# Patient Record
Sex: Male | Born: 1955 | Race: White | Hispanic: No | Marital: Married | Smoking: Former smoker
Health system: Southern US, Community
[De-identification: ages and names within clinical notes are randomized; demographics above are authoritative.]

## PROBLEM LIST (undated history)

## (undated) DIAGNOSIS — T7840XA Allergy, unspecified, initial encounter: Secondary | ICD-10-CM

## (undated) DIAGNOSIS — K219 Gastro-esophageal reflux disease without esophagitis: Secondary | ICD-10-CM

## (undated) DIAGNOSIS — E785 Hyperlipidemia, unspecified: Secondary | ICD-10-CM

## (undated) DIAGNOSIS — I2510 Atherosclerotic heart disease of native coronary artery without angina pectoris: Secondary | ICD-10-CM

## (undated) DIAGNOSIS — E119 Type 2 diabetes mellitus without complications: Secondary | ICD-10-CM

## (undated) DIAGNOSIS — J45909 Unspecified asthma, uncomplicated: Secondary | ICD-10-CM

## (undated) DIAGNOSIS — N419 Inflammatory disease of prostate, unspecified: Secondary | ICD-10-CM

## (undated) DIAGNOSIS — F329 Major depressive disorder, single episode, unspecified: Secondary | ICD-10-CM

## (undated) DIAGNOSIS — F419 Anxiety disorder, unspecified: Secondary | ICD-10-CM

## (undated) DIAGNOSIS — I10 Essential (primary) hypertension: Secondary | ICD-10-CM

## (undated) DIAGNOSIS — R809 Proteinuria, unspecified: Secondary | ICD-10-CM

## (undated) DIAGNOSIS — G4730 Sleep apnea, unspecified: Secondary | ICD-10-CM

## (undated) DIAGNOSIS — I219 Acute myocardial infarction, unspecified: Secondary | ICD-10-CM

## (undated) DIAGNOSIS — K5790 Diverticulosis of intestine, part unspecified, without perforation or abscess without bleeding: Secondary | ICD-10-CM

## (undated) DIAGNOSIS — R011 Cardiac murmur, unspecified: Secondary | ICD-10-CM

## (undated) DIAGNOSIS — F32A Depression, unspecified: Secondary | ICD-10-CM

## (undated) HISTORY — PX: CORONARY ANGIOPLASTY: SHX604

## (undated) HISTORY — PX: OTHER SURGICAL HISTORY: SHX169

## (undated) HISTORY — PX: TONSILLECTOMY: SUR1361

---

## 1994-09-28 HISTORY — PX: CORONARY ARTERY BYPASS GRAFT: SHX141

## 2005-04-09 ENCOUNTER — Ambulatory Visit: Payer: Self-pay | Admitting: Internal Medicine

## 2006-03-26 ENCOUNTER — Ambulatory Visit: Payer: Self-pay | Admitting: Unknown Physician Specialty

## 2006-07-16 ENCOUNTER — Ambulatory Visit: Payer: Self-pay | Admitting: Otolaryngology

## 2006-09-23 ENCOUNTER — Ambulatory Visit: Payer: Self-pay | Admitting: Unknown Physician Specialty

## 2006-10-28 ENCOUNTER — Inpatient Hospital Stay: Payer: Self-pay | Admitting: Surgery

## 2006-12-09 HISTORY — PX: COLON SURGERY: SHX602

## 2007-06-20 ENCOUNTER — Ambulatory Visit: Payer: Self-pay | Admitting: Emergency Medicine

## 2007-08-21 ENCOUNTER — Inpatient Hospital Stay: Payer: Self-pay | Admitting: Internal Medicine

## 2007-08-21 ENCOUNTER — Other Ambulatory Visit: Payer: Self-pay

## 2008-04-11 ENCOUNTER — Ambulatory Visit: Payer: Self-pay | Admitting: Internal Medicine

## 2008-04-19 ENCOUNTER — Ambulatory Visit: Payer: Self-pay | Admitting: Internal Medicine

## 2008-06-09 ENCOUNTER — Ambulatory Visit: Payer: Self-pay | Admitting: Internal Medicine

## 2009-03-24 ENCOUNTER — Ambulatory Visit: Payer: Self-pay | Admitting: Internal Medicine

## 2011-07-29 ENCOUNTER — Ambulatory Visit: Payer: Self-pay | Admitting: Unknown Physician Specialty

## 2012-05-20 DIAGNOSIS — I25119 Atherosclerotic heart disease of native coronary artery with unspecified angina pectoris: Secondary | ICD-10-CM | POA: Insufficient documentation

## 2013-11-19 ENCOUNTER — Ambulatory Visit: Payer: Self-pay | Admitting: Unknown Physician Specialty

## 2013-11-19 HISTORY — PX: ESOPHAGOGASTRODUODENOSCOPY: SHX1529

## 2013-11-22 LAB — PATHOLOGY REPORT

## 2014-03-15 ENCOUNTER — Emergency Department: Payer: Self-pay | Admitting: Emergency Medicine

## 2014-06-02 DIAGNOSIS — R809 Proteinuria, unspecified: Secondary | ICD-10-CM | POA: Insufficient documentation

## 2016-04-03 ENCOUNTER — Encounter: Payer: Self-pay | Admitting: Gynecology

## 2016-04-03 ENCOUNTER — Ambulatory Visit
Admission: EM | Admit: 2016-04-03 | Discharge: 2016-04-03 | Disposition: A | Discharge status: Home / Self Care | Payer: BLUE CROSS/BLUE SHIELD | Attending: Family Medicine | Admitting: Family Medicine

## 2016-04-03 DIAGNOSIS — W57XXXA Bitten or stung by nonvenomous insect and other nonvenomous arthropods, initial encounter: Secondary | ICD-10-CM | POA: Diagnosis not present

## 2016-04-03 DIAGNOSIS — T148 Other injury of unspecified body region: Secondary | ICD-10-CM

## 2016-04-03 HISTORY — DX: Unspecified asthma, uncomplicated: J45.909

## 2016-04-03 HISTORY — DX: Hyperlipidemia, unspecified: E78.5

## 2016-04-03 HISTORY — DX: Anxiety disorder, unspecified: F41.9

## 2016-04-03 HISTORY — DX: Gastro-esophageal reflux disease without esophagitis: K21.9

## 2016-04-03 HISTORY — DX: Sleep apnea, unspecified: G47.30

## 2016-04-03 HISTORY — DX: Inflammatory disease of prostate, unspecified: N41.9

## 2016-04-03 HISTORY — DX: Major depressive disorder, single episode, unspecified: F32.9

## 2016-04-03 HISTORY — DX: Cardiac murmur, unspecified: R01.1

## 2016-04-03 HISTORY — DX: Type 2 diabetes mellitus without complications: E11.9

## 2016-04-03 HISTORY — DX: Depression, unspecified: F32.A

## 2016-04-03 MED ORDER — DOXYCYCLINE HYCLATE 100 MG PO CAPS: 100.0000 mg | ORAL_CAPSULE | Freq: Two times a day (BID) | ORAL | Status: DC

## 2016-04-03 NOTE — ED Provider Notes (Signed)
CSN: [id number]     Arrival date & time 04/03/16  1952 History   First MD Initiated Contact with Patient 04/03/16 2008     Chief Complaint  Patient presents with  . Insect Bite   (Consider location/radiation/quality/duration/timing/severity/associated sxs/prior Treatment) HPI: Patient presents today with what he believes to be a tick bite on his right inner thigh. Patient states that his wife tried to get a portion of it out before coming here today. He is unable to see it given the location. He is unsure as to how long it has been there but he was outdoors earlier today. He denies any other problems today. He denies any history of Lyme's Disease or [place] Spotted Fever.  No past medical history on file. No past surgical history on file. No family history on file. Social History  Substance Use Topics  . Smoking status: Not on file  . Smokeless tobacco: Not on file  . Alcohol Use: Not on file    Review of Systems : Negative except mentioned above.   Allergies  Ace inhibitors  Home Medications   Prior to Admission medications   Medication Sig Start Date End Date Taking? Authorizing Provider  doxycycline (VIBRAMYCIN) 100 MG capsule Take 1 capsule (100 mg total) by mouth 2 (two) times daily. 04/03/16   Jolene ProvostKirtida Donavyn Fecher, MD   Meds Ordered and Administered this Visit  Medications - No data to display  BP 153/56 mmHg  Pulse 70  Temp(Src) 98.5 F (36.9 C) (Oral)  Resp 16  Ht 5\' 9"  (1.753 m)  Wt 195 lb (88.451 kg)  BMI 28.78 kg/m2  SpO2 98% No data found.   Physical Exam   GENERAL: NAD RESP: CTA B CARD: RRR SKIN: 0.5 in circular erythematous lesion on right inner thigh, there is half the tick engorged in the center NEURO: CN II-XII grossly intact   ED Course  Procedures (including critical care time)  Labs Review Labs Reviewed - No data to display  Imaging Review No results found.      MDM   1. Tick bite    Splinter forceps were used to take out the  portion of the tick that was engorged in the site. Patient tolerated the procedure well. Patient with doxycycline for 10 days. Encourage patient to monitor for any tick borne illness symptoms. If any further problems to follow up with primary care physician or our office.    Jolene ProvostKirtida Kenniel Bergsma, MD 04/03/16 2037

## 2016-04-03 NOTE — ED Notes (Signed)
Patient c/o ? Insect bite right inner thigh x today.

## 2016-09-12 ENCOUNTER — Encounter: Payer: Self-pay | Admitting: *Deleted

## 2016-09-13 ENCOUNTER — Ambulatory Visit
Admission: RE | Admit: 2016-09-13 | Discharge: 2016-09-13 | Disposition: A | Discharge status: Home / Self Care | Payer: BLUE CROSS/BLUE SHIELD | Source: Ambulatory Visit | Attending: Unknown Physician Specialty | Admitting: Unknown Physician Specialty

## 2016-09-13 ENCOUNTER — Ambulatory Visit: Payer: BLUE CROSS/BLUE SHIELD | Admitting: Anesthesiology

## 2016-09-13 ENCOUNTER — Encounter: Payer: Self-pay | Admitting: *Deleted

## 2016-09-13 ENCOUNTER — Encounter
Admission: RE | Disposition: A | Discharge status: Home / Self Care | Payer: Self-pay | Source: Ambulatory Visit | Attending: Unknown Physician Specialty

## 2016-09-13 DIAGNOSIS — I252 Old myocardial infarction: Secondary | ICD-10-CM | POA: Insufficient documentation

## 2016-09-13 DIAGNOSIS — F329 Major depressive disorder, single episode, unspecified: Secondary | ICD-10-CM | POA: Diagnosis not present

## 2016-09-13 DIAGNOSIS — I2510 Atherosclerotic heart disease of native coronary artery without angina pectoris: Secondary | ICD-10-CM | POA: Insufficient documentation

## 2016-09-13 DIAGNOSIS — G4730 Sleep apnea, unspecified: Secondary | ICD-10-CM | POA: Diagnosis not present

## 2016-09-13 DIAGNOSIS — Z7982 Long term (current) use of aspirin: Secondary | ICD-10-CM | POA: Diagnosis not present

## 2016-09-13 DIAGNOSIS — Z1211 Encounter for screening for malignant neoplasm of colon: Secondary | ICD-10-CM | POA: Diagnosis not present

## 2016-09-13 DIAGNOSIS — Z87891 Personal history of nicotine dependence: Secondary | ICD-10-CM | POA: Insufficient documentation

## 2016-09-13 DIAGNOSIS — F419 Anxiety disorder, unspecified: Secondary | ICD-10-CM | POA: Insufficient documentation

## 2016-09-13 DIAGNOSIS — Z7984 Long term (current) use of oral hypoglycemic drugs: Secondary | ICD-10-CM | POA: Insufficient documentation

## 2016-09-13 DIAGNOSIS — J45909 Unspecified asthma, uncomplicated: Secondary | ICD-10-CM | POA: Insufficient documentation

## 2016-09-13 DIAGNOSIS — K5730 Diverticulosis of large intestine without perforation or abscess without bleeding: Secondary | ICD-10-CM | POA: Insufficient documentation

## 2016-09-13 DIAGNOSIS — I10 Essential (primary) hypertension: Secondary | ICD-10-CM | POA: Insufficient documentation

## 2016-09-13 DIAGNOSIS — K640 First degree hemorrhoids: Secondary | ICD-10-CM | POA: Insufficient documentation

## 2016-09-13 DIAGNOSIS — Z955 Presence of coronary angioplasty implant and graft: Secondary | ICD-10-CM | POA: Insufficient documentation

## 2016-09-13 DIAGNOSIS — Z79899 Other long term (current) drug therapy: Secondary | ICD-10-CM | POA: Diagnosis not present

## 2016-09-13 DIAGNOSIS — Z8371 Family history of colonic polyps: Secondary | ICD-10-CM | POA: Insufficient documentation

## 2016-09-13 DIAGNOSIS — E119 Type 2 diabetes mellitus without complications: Secondary | ICD-10-CM | POA: Diagnosis not present

## 2016-09-13 DIAGNOSIS — K219 Gastro-esophageal reflux disease without esophagitis: Secondary | ICD-10-CM | POA: Diagnosis not present

## 2016-09-13 DIAGNOSIS — E785 Hyperlipidemia, unspecified: Secondary | ICD-10-CM | POA: Insufficient documentation

## 2016-09-13 HISTORY — DX: Diverticulosis of intestine, part unspecified, without perforation or abscess without bleeding: K57.90

## 2016-09-13 HISTORY — DX: Essential (primary) hypertension: I10

## 2016-09-13 HISTORY — DX: Proteinuria, unspecified: R80.9

## 2016-09-13 HISTORY — DX: Atherosclerotic heart disease of native coronary artery without angina pectoris: I25.10

## 2016-09-13 HISTORY — PX: COLONOSCOPY WITH PROPOFOL: SHX5780

## 2016-09-13 HISTORY — DX: Allergy, unspecified, initial encounter: T78.40XA

## 2016-09-13 LAB — GLUCOSE, CAPILLARY: GLUCOSE-CAPILLARY: 127 mg/dL — AB (ref 65–99)

## 2016-09-13 SURGERY — COLONOSCOPY WITH PROPOFOL
Anesthesia: General

## 2016-09-13 MED ORDER — SODIUM CHLORIDE 0.9 % IV SOLN
INTRAVENOUS | Status: DC
  Administered 2016-09-13: 1000 mL via INTRAVENOUS

## 2016-09-13 MED ORDER — FENTANYL CITRATE (PF) 100 MCG/2ML IJ SOLN
INTRAMUSCULAR | Status: DC | PRN
  Administered 2016-09-13: 50 ug via INTRAVENOUS

## 2016-09-13 MED ORDER — METOPROLOL SUCCINATE ER 25 MG PO TB24
ORAL_TABLET | ORAL | Status: AC
  Administered 2016-09-13: 25 mg
  Filled 2016-09-13: qty 1

## 2016-09-13 MED ORDER — PROPOFOL 10 MG/ML IV BOLUS
INTRAVENOUS | Status: DC | PRN
  Administered 2016-09-13: 20 mg via INTRAVENOUS
  Administered 2016-09-13: 50 mg via INTRAVENOUS

## 2016-09-13 MED ORDER — PROPOFOL 500 MG/50ML IV EMUL
INTRAVENOUS | Status: DC | PRN
  Administered 2016-09-13: 100 ug/kg/min via INTRAVENOUS

## 2016-09-13 MED ORDER — LIDOCAINE HCL (CARDIAC) 20 MG/ML IV SOLN
INTRAVENOUS | Status: DC | PRN
  Administered 2016-09-13: 30 mg via INTRAVENOUS

## 2016-09-13 NOTE — Anesthesia Preprocedure Evaluation (Signed)
Anesthesia Evaluation  Patient identified by MRN, date of birth, ID band Patient awake    Reviewed: Allergy & Precautions, NPO status , Patient's Chart, lab work & pertinent test results  History of Anesthesia Complications Negative for: history of anesthetic complications  Airway Mallampati: II       Dental   Pulmonary asthma , sleep apnea , former smoker,           Cardiovascular hypertension, Pt. on medications and Pt. on home beta blockers + CAD, + Past MI, + Cardiac Stents and + CABG  + Valvular Problems/Murmurs (murmur)      Neuro/Psych negative neurological ROS     GI/Hepatic Neg liver ROS, GERD  Medicated and Poorly Controlled,  Endo/Other  diabetes, Type 2, Oral Hypoglycemic Agents  Renal/GU negative Renal ROS     Musculoskeletal   Abdominal   Peds  Hematology negative hematology ROS (+)   Anesthesia Other Findings   Reproductive/Obstetrics                             Anesthesia Physical Anesthesia Plan  ASA: III  Anesthesia Plan: General   Post-op Pain Management:    Induction: Intravenous  Airway Management Planned: Nasal Cannula  Additional Equipment:   Intra-op Plan:   Post-operative Plan:   Informed Consent: I have reviewed the patients History and Physical, chart, labs and discussed the procedure including the risks, benefits and alternatives for the proposed anesthesia with the patient or authorized representative who has indicated his/her understanding and acceptance.     Plan Discussed with:   Anesthesia Plan Comments:         Anesthesia Quick Evaluation

## 2016-09-13 NOTE — Op Note (Addendum)
[facility] Gastroenterology Patient Name: Nicholas Knapp Procedure Date: 09/13/2016 9:44 AM MRN: [id number] Account #: [id number] Date of Birth: June 15, 1956 Admit Type: Outpatient Age: 60 Room: [facility] ENDO ROOM 4 Gender: Male Note Status: Finalized Procedure:            Colonoscopy Indications:          Colon cancer screening in patient at increased risk:                        Family history of 1st-degree relative with colon polyps Providers:            Scot Jun, MD Referring MD:         Daniel Nones, MD (Referring MD) Medicines:            Propofol per Anesthesia Complications:        No immediate complications. Procedure:            Pre-Anesthesia Assessment:                       - After reviewing the risks and benefits, the patient                        was deemed in satisfactory condition to undergo the                        procedure.                       After obtaining informed consent, the colonoscope was                        passed under direct vision. Throughout the procedure,                        the patient's blood pressure, pulse, and oxygen                        saturations were monitored continuously. The                        Colonoscope was introduced through the anus and                        advanced to the the cecum, identified by appendiceal                        orifice and ileocecal valve. The colonoscopy was                        somewhat difficult. The patient tolerated the procedure                        well. The quality of the bowel preparation was                        excellent. Findings:      A few small and large-mouthed diverticula were found in the sigmoid       colon.      Internal hemorrhoids were found during endoscopy. The hemorrhoids were       medium-sized and Grade I (internal hemorrhoids  that do not prolapse).      The exam was otherwise without abnormality. Impression:           - Diverticulosis in  the sigmoid colon.                       - Internal hemorrhoids.                       - The examination was otherwise normal.                       - No specimens collected. Recommendation:       - Repeat colonoscopy in 5 years for screening purposes. Scot Junobert T Kinzlee Selvy, MD 09/13/2016 10:53:20 AM This report has been signed electronically. Number of Addenda: 0 Note Initiated On: 09/13/2016 9:44 AM Scope Withdrawal Time: 0 hours 13 minutes 32 seconds  Total Procedure Duration: 0 hours 21 minutes 39 seconds       [facility]

## 2016-09-13 NOTE — Transfer of Care (Signed)
Immediate Anesthesia Transfer of Care Note  Patient: Nicholas Knapp  Procedure(s) Performed: Procedure(s): COLONOSCOPY WITH PROPOFOL (N/A)  Patient Location: PACU and Endoscopy Unit  Anesthesia Type:General  Level of Consciousness: alert   Airway & Oxygen Therapy: Patient connected to nasal cannula oxygen  Post-op Assessment: Report given to RN  Post vital signs: stable  Last Vitals:  Vitals:   09/13/16 0937  BP: 134/73  Pulse: 65  Resp: 14  Temp: 36.2 C    Last Pain:  Vitals:   09/13/16 0937  TempSrc: Tympanic         Complications: No apparent anesthesia complications

## 2016-09-13 NOTE — Anesthesia Postprocedure Evaluation (Signed)
Anesthesia Post Note  Patient: Nicholas Knapp  Procedure(s) Performed: Procedure(s) (LRB): COLONOSCOPY WITH PROPOFOL (N/A)  Patient location during evaluation: Endoscopy Anesthesia Type: General Level of consciousness: awake and alert Pain management: pain level controlled Vital Signs Assessment: post-procedure vital signs reviewed and stable Respiratory status: spontaneous breathing and respiratory function stable Cardiovascular status: stable Anesthetic complications: no    Last Vitals:  Vitals:   09/13/16 1055 09/13/16 1105  BP: (!) 117/58 108/72  Pulse: 72 62  Resp: 15 14  Temp: 36.2 C     Last Pain:  Vitals:   09/13/16 1055  TempSrc: Tympanic  PainSc: Asleep                 KEPHART,WILLIAM K

## 2016-09-13 NOTE — H&P (Signed)
Primary Care Physician:  Lynnea FerrierBERT J KLEIN III, MD Primary Gastroenterologist:  Dr. Mechele CollinElliott  Pre-Procedure History & Physical: HPI:  Nicholas Knapp is a 60 y.o. male is here for an colonoscopy.   Past Medical History:  Diagnosis Date  . Allergic state   . Anxiety   . Asthma   . Coronary artery disease   . Depression   . Diabetes mellitus without complication (HCC)   . Diverticulosis   . GERD (gastroesophageal reflux disease)   . Hyperlipemia   . Hypertension   . Midsystolic murmur   . Prostatitis   . Proteinuria   . Sleep apnea     Past Surgical History:  Procedure Laterality Date  . COLON SURGERY    . CORONARY ANGIOPLASTY    . double bipass    . sinus sugery    . TONSILLECTOMY      Prior to Admission medications   Medication Sig Start Date End Date Taking? Authorizing Provider  glimepiride (AMARYL) 2 MG tablet Take 2 mg by mouth daily with breakfast.   Yes Historical Provider, MD  metoprolol succinate (TOPROL-XL) 25 MG 24 hr tablet Take 25 mg by mouth daily.   Yes Historical Provider, MD  aspirin 81 MG tablet Take 81 mg by mouth daily.    Historical Provider, MD  DULoxetine (CYMBALTA) 60 MG capsule Take 60 mg by mouth daily.    Historical Provider, MD  EPINEPHrine (EPIPEN 2-PAK) 0.3 mg/0.3 mL IJ SOAJ injection Inject into the muscle once.    Historical Provider, MD  glucose blood test strip 1 each by Other route as needed for other. Use as instructed    Historical Provider, MD  losartan (COZAAR) 25 MG tablet Take 25 mg by mouth daily.    Historical Provider, MD  nitroGLYCERIN (NITROSTAT) 0.4 MG SL tablet Place 0.4 mg under the tongue every 5 (five) minutes as needed for chest pain.    Historical Provider, MD  pantoprazole (PROTONIX) 40 MG tablet Take 40 mg by mouth daily.    Historical Provider, MD  rosuvastatin (CRESTOR) 20 MG tablet Take 20 mg by mouth daily.    Historical Provider, MD    Allergies as of 07/29/2016 - Review Complete 04/03/2016  Allergen Reaction  Noted  . Bee venom Anaphylaxis 04/03/2016  . Ace inhibitors Cough 04/03/2016  . Isosorbide  04/03/2016  . Lamotrigine  04/03/2016  . Metformin and related Diarrhea 04/03/2016  . Bupropion Palpitations 04/03/2016    History reviewed. No pertinent family history.  Social History   Social History  . Marital status: Married    Spouse name: N/A  . Number of children: N/A  . Years of education: N/A   Occupational History  . Not on file.   Social History Main Topics  . Smoking status: Former Smoker    Packs/day: 1.00    Years: 25.00    Types: Cigarettes    Quit date: 05/20/1994  . Smokeless tobacco: Never Used  . Alcohol use Yes  . Drug use: No  . Sexual activity: Not on file   Other Topics Concern  . Not on file   Social History Narrative  . No narrative on file    Review of Systems: See HPI, otherwise negative ROS  Physical Exam: BP 134/73   Pulse 65   Temp 97.2 F (36.2 C) (Tympanic)   Resp 14   Ht 5\' 9"  (1.753 m)   Wt 88.5 kg (195 lb)   SpO2 99%   BMI 28.80 kg/m  General:   Alert,  pleasant and cooperative in NAD Head:  Normocephalic and atraumatic. Neck:  Supple; no masses or thyromegaly. Lungs:  Clear throughout to auscultation.    Heart:  Regular rate and rhythm. Abdomen:  Soft, nontender and nondistended. Normal bowel sounds, without guarding, and without rebound.   Neurologic:  Alert and  oriented x4;  grossly normal neurologically.  Impression/Plan: Nicholas Knapp is here for an colonoscopy to be performed for FH colon polyps  Risks, benefits, limitations, and alternatives regarding  colonoscopy have been reviewed with the patient.  Questions have been answered.  All parties agreeable.   Lynnae Prude, MD  09/13/2016, 10:24 AM

## 2016-09-15 NOTE — Progress Notes (Signed)
Non-identifying voicemail.  No message left. 

## 2016-09-16 ENCOUNTER — Encounter: Payer: Self-pay | Admitting: Unknown Physician Specialty

## 2018-03-18 DIAGNOSIS — M7712 Lateral epicondylitis, left elbow: Secondary | ICD-10-CM | POA: Insufficient documentation

## 2018-03-23 ENCOUNTER — Encounter
Admission: RE | Admit: 2018-03-23 | Discharge: 2018-03-23 | Disposition: A | Discharge status: Home / Self Care | Payer: BLUE CROSS/BLUE SHIELD | Source: Ambulatory Visit | Attending: Surgery | Admitting: Surgery

## 2018-03-23 ENCOUNTER — Other Ambulatory Visit: Payer: Self-pay

## 2018-03-23 DIAGNOSIS — I25810 Atherosclerosis of coronary artery bypass graft(s) without angina pectoris: Secondary | ICD-10-CM | POA: Diagnosis not present

## 2018-03-23 DIAGNOSIS — E119 Type 2 diabetes mellitus without complications: Secondary | ICD-10-CM | POA: Diagnosis not present

## 2018-03-23 DIAGNOSIS — G4730 Sleep apnea, unspecified: Secondary | ICD-10-CM | POA: Diagnosis not present

## 2018-03-23 DIAGNOSIS — Z01810 Encounter for preprocedural cardiovascular examination: Secondary | ICD-10-CM

## 2018-03-23 DIAGNOSIS — Z955 Presence of coronary angioplasty implant and graft: Secondary | ICD-10-CM | POA: Diagnosis not present

## 2018-03-23 DIAGNOSIS — F419 Anxiety disorder, unspecified: Secondary | ICD-10-CM | POA: Diagnosis not present

## 2018-03-23 DIAGNOSIS — Z7982 Long term (current) use of aspirin: Secondary | ICD-10-CM | POA: Diagnosis not present

## 2018-03-23 DIAGNOSIS — K219 Gastro-esophageal reflux disease without esophagitis: Secondary | ICD-10-CM | POA: Diagnosis not present

## 2018-03-23 DIAGNOSIS — I252 Old myocardial infarction: Secondary | ICD-10-CM | POA: Diagnosis not present

## 2018-03-23 DIAGNOSIS — E785 Hyperlipidemia, unspecified: Secondary | ICD-10-CM | POA: Diagnosis not present

## 2018-03-23 DIAGNOSIS — I10 Essential (primary) hypertension: Secondary | ICD-10-CM | POA: Diagnosis not present

## 2018-03-23 DIAGNOSIS — F329 Major depressive disorder, single episode, unspecified: Secondary | ICD-10-CM | POA: Diagnosis not present

## 2018-03-23 DIAGNOSIS — Z951 Presence of aortocoronary bypass graft: Secondary | ICD-10-CM | POA: Diagnosis not present

## 2018-03-23 DIAGNOSIS — I2510 Atherosclerotic heart disease of native coronary artery without angina pectoris: Secondary | ICD-10-CM | POA: Diagnosis not present

## 2018-03-23 DIAGNOSIS — Z87891 Personal history of nicotine dependence: Secondary | ICD-10-CM | POA: Diagnosis not present

## 2018-03-23 DIAGNOSIS — Z7984 Long term (current) use of oral hypoglycemic drugs: Secondary | ICD-10-CM | POA: Diagnosis not present

## 2018-03-23 DIAGNOSIS — M7711 Lateral epicondylitis, right elbow: Secondary | ICD-10-CM | POA: Diagnosis not present

## 2018-03-23 DIAGNOSIS — Z79899 Other long term (current) drug therapy: Secondary | ICD-10-CM | POA: Diagnosis not present

## 2018-03-23 HISTORY — DX: Acute myocardial infarction, unspecified: I21.9

## 2018-03-23 NOTE — Patient Instructions (Signed)
Your procedure is scheduled on: Thursday, March 26, 2018 Report [facility] To find out your arrival time, please call [phone] between 1PM - 3PM on: Wednesday, March 25, 2018  REMEMBER: Instructions that are not followed completely may result in serious medical risk, up to and including death; or upon the discretion of your surgeon and anesthesiologist your surgery may need to be rescheduled.  Do not eat food after midnight the night before your procedure.  No gum chewing, lozengers or hard candies.  You may however, drink water up to 2 hours before you are scheduled to arrive for your surgery.  No Alcohol for 24 hours before or after surgery.  No Smoking including e-cigarettes for 24 hours prior to surgery.  No chewable tobacco products for at least 6 hours prior to surgery.  No nicotine patches on the day of surgery.  On the morning of surgery brush your teeth with toothpaste and water, you may rinse your mouth with mouthwash if you wish. Do not swallow any toothpaste or mouthwash.  Notify your doctor if there is any change in your medical condition (cold, fever, infection).  Do not wear jewelry, make-up, hairpins, clips or nail polish.  Do not wear lotions, powders, or perfumes. You may wear deodorant.  Do not shave 48 hours prior to surgery. Men may shave face and neck.  Contacts and dentures may not be worn into surgery.  Do not bring valuables to the hospital, including drivers license, insurance or credit cards.  [place] is not responsible for any belongings or valuables.   TAKE THESE MEDICATIONS THE MORNING OF SURGERY:  1.  PANTOPRAZOLE 2.  CYMBALTA  Use CHG Soap as directed on instruction sheet.  Follow recommendations from Cardiologist, or PCP regarding stopping Aspirin.  NOW!  Stop Anti-inflammatories (NSAIDS) such as Advil, Aleve, Ibuprofen, Motrin, Naproxen, Naprosyn and Aspirin based products such as  Excedrin, Goodys Powder, BC Powder. (May take Tylenol or Acetaminophen if needed.)  NOW!  Stop ANY OVER THE COUNTER supplements until after surgery.  Wear comfortable clothing (specific to your surgery type) to the hospital.  Plan for stool softeners for home use.  If you are being discharged the day of surgery, you will not be allowed to drive home. You will need a responsible adult to drive you home and stay with you that night.   If you are taking public transportation, you will need to have a responsible adult with you. Please confirm with your physician that it is acceptable to use public transportation.   Please call [phone] if you have any questions about these instructions.

## 2018-03-23 NOTE — Pre-Procedure Instructions (Signed)
The results of the EKG performed today was reviewed by Dr. Pernell DupreAdams (anesthesia), along with the patients cardiac history. No further workup or clearance required for surgery at this time.

## 2018-03-25 MED ORDER — CEFAZOLIN SODIUM-DEXTROSE 2-4 GM/100ML-% IV SOLN
2.0000 g | Freq: Once | INTRAVENOUS | Status: AC
  Administered 2018-03-26: 2 g via INTRAVENOUS
  Filled 2018-03-25: qty 100

## 2018-03-26 ENCOUNTER — Encounter
Admission: RE | Disposition: A | Discharge status: Home / Self Care | Payer: Self-pay | Source: Ambulatory Visit | Attending: Surgery

## 2018-03-26 ENCOUNTER — Ambulatory Visit: Payer: BLUE CROSS/BLUE SHIELD | Admitting: Certified Registered"

## 2018-03-26 ENCOUNTER — Ambulatory Visit
Admission: RE | Admit: 2018-03-26 | Discharge: 2018-03-26 | Disposition: A | Discharge status: Home / Self Care | Payer: BLUE CROSS/BLUE SHIELD | Source: Ambulatory Visit | Attending: Surgery | Admitting: Surgery

## 2018-03-26 ENCOUNTER — Other Ambulatory Visit: Payer: Self-pay

## 2018-03-26 DIAGNOSIS — M7711 Lateral epicondylitis, right elbow: Secondary | ICD-10-CM | POA: Diagnosis not present

## 2018-03-26 DIAGNOSIS — I25810 Atherosclerosis of coronary artery bypass graft(s) without angina pectoris: Secondary | ICD-10-CM | POA: Insufficient documentation

## 2018-03-26 DIAGNOSIS — E119 Type 2 diabetes mellitus without complications: Secondary | ICD-10-CM | POA: Insufficient documentation

## 2018-03-26 DIAGNOSIS — G4730 Sleep apnea, unspecified: Secondary | ICD-10-CM | POA: Insufficient documentation

## 2018-03-26 DIAGNOSIS — K219 Gastro-esophageal reflux disease without esophagitis: Secondary | ICD-10-CM | POA: Insufficient documentation

## 2018-03-26 DIAGNOSIS — Z7982 Long term (current) use of aspirin: Secondary | ICD-10-CM | POA: Insufficient documentation

## 2018-03-26 DIAGNOSIS — E785 Hyperlipidemia, unspecified: Secondary | ICD-10-CM | POA: Insufficient documentation

## 2018-03-26 DIAGNOSIS — Z951 Presence of aortocoronary bypass graft: Secondary | ICD-10-CM | POA: Insufficient documentation

## 2018-03-26 DIAGNOSIS — I252 Old myocardial infarction: Secondary | ICD-10-CM | POA: Insufficient documentation

## 2018-03-26 DIAGNOSIS — Z79899 Other long term (current) drug therapy: Secondary | ICD-10-CM | POA: Insufficient documentation

## 2018-03-26 DIAGNOSIS — F329 Major depressive disorder, single episode, unspecified: Secondary | ICD-10-CM | POA: Insufficient documentation

## 2018-03-26 DIAGNOSIS — F419 Anxiety disorder, unspecified: Secondary | ICD-10-CM | POA: Insufficient documentation

## 2018-03-26 DIAGNOSIS — Z7984 Long term (current) use of oral hypoglycemic drugs: Secondary | ICD-10-CM | POA: Insufficient documentation

## 2018-03-26 DIAGNOSIS — Z87891 Personal history of nicotine dependence: Secondary | ICD-10-CM | POA: Insufficient documentation

## 2018-03-26 DIAGNOSIS — I2510 Atherosclerotic heart disease of native coronary artery without angina pectoris: Secondary | ICD-10-CM | POA: Insufficient documentation

## 2018-03-26 DIAGNOSIS — Z955 Presence of coronary angioplasty implant and graft: Secondary | ICD-10-CM | POA: Insufficient documentation

## 2018-03-26 DIAGNOSIS — I10 Essential (primary) hypertension: Secondary | ICD-10-CM | POA: Insufficient documentation

## 2018-03-26 HISTORY — PX: TENDON RECONSTRUCTION: SHX2487

## 2018-03-26 LAB — GLUCOSE, CAPILLARY
Glucose-Capillary: 145 mg/dL — ABNORMAL HIGH (ref 65–99)
Glucose-Capillary: 133 mg/dL — ABNORMAL HIGH (ref 65–99)

## 2018-03-26 SURGERY — RECONSTRUCTION, TENDON OR LIGAMENT, ELBOW
Anesthesia: General | Site: Elbow | Laterality: Right | Wound class: Clean

## 2018-03-26 MED ORDER — ONDANSETRON HCL 4 MG/2ML IJ SOLN
INTRAMUSCULAR | Status: DC | PRN
  Administered 2018-03-26: 4 mg via INTRAVENOUS

## 2018-03-26 MED ORDER — LIDOCAINE HCL (PF) 2 % IJ SOLN
INTRAMUSCULAR | Status: AC
  Filled 2018-03-26: qty 10

## 2018-03-26 MED ORDER — MEPERIDINE HCL 50 MG/ML IJ SOLN: 6.2500 mg | INTRAMUSCULAR | Status: DC | PRN

## 2018-03-26 MED ORDER — LIDOCAINE HCL (PF) 2 % IJ SOLN
INTRAMUSCULAR | Status: DC | PRN
  Administered 2018-03-26: 50 mg

## 2018-03-26 MED ORDER — BUPIVACAINE HCL (PF) 0.5 % IJ SOLN
INTRAMUSCULAR | Status: DC | PRN
  Administered 2018-03-26: 10 mL

## 2018-03-26 MED ORDER — ONDANSETRON HCL 4 MG/2ML IJ SOLN: 4.0000 mg | Freq: Four times a day (QID) | INTRAMUSCULAR | Status: DC | PRN

## 2018-03-26 MED ORDER — FENTANYL CITRATE (PF) 100 MCG/2ML IJ SOLN
INTRAMUSCULAR | Status: AC
  Administered 2018-03-26: 25 ug via INTRAVENOUS
  Filled 2018-03-26: qty 2

## 2018-03-26 MED ORDER — GLYCOPYRROLATE 0.2 MG/ML IJ SOLN
INTRAMUSCULAR | Status: DC | PRN
  Administered 2018-03-26: 0.2 mg via INTRAVENOUS

## 2018-03-26 MED ORDER — GLYCOPYRROLATE 0.2 MG/ML IJ SOLN
INTRAMUSCULAR | Status: AC
  Filled 2018-03-26: qty 1

## 2018-03-26 MED ORDER — BUPIVACAINE HCL (PF) 0.5 % IJ SOLN
INTRAMUSCULAR | Status: AC
  Filled 2018-03-26: qty 30

## 2018-03-26 MED ORDER — HYDROCODONE-ACETAMINOPHEN 5-325 MG PO TABS: 1.0000 | ORAL_TABLET | Freq: Four times a day (QID) | ORAL | 0 refills | Status: DC | PRN

## 2018-03-26 MED ORDER — DEXAMETHASONE SODIUM PHOSPHATE 10 MG/ML IJ SOLN
INTRAMUSCULAR | Status: AC
  Filled 2018-03-26: qty 1

## 2018-03-26 MED ORDER — OXYCODONE HCL 5 MG/5ML PO SOLN: 5.0000 mg | Freq: Once | ORAL | Status: AC | PRN

## 2018-03-26 MED ORDER — FENTANYL CITRATE (PF) 100 MCG/2ML IJ SOLN
INTRAMUSCULAR | Status: AC
  Filled 2018-03-26: qty 2

## 2018-03-26 MED ORDER — NEOMYCIN-POLYMYXIN B GU 40-200000 IR SOLN
Status: AC
  Filled 2018-03-26: qty 2

## 2018-03-26 MED ORDER — CEFAZOLIN SODIUM-DEXTROSE 2-3 GM-%(50ML) IV SOLR
INTRAVENOUS | Status: AC
  Filled 2018-03-26: qty 50

## 2018-03-26 MED ORDER — OXYCODONE HCL 5 MG PO TABS
ORAL_TABLET | ORAL | Status: AC
  Filled 2018-03-26: qty 1

## 2018-03-26 MED ORDER — SODIUM CHLORIDE 0.9 % IV SOLN
INTRAVENOUS | Status: DC
  Administered 2018-03-26: 11:00:00 via INTRAVENOUS

## 2018-03-26 MED ORDER — OXYCODONE HCL 5 MG PO TABS
5.0000 mg | ORAL_TABLET | Freq: Once | ORAL | Status: AC | PRN
  Administered 2018-03-26: 5 mg via ORAL

## 2018-03-26 MED ORDER — ONDANSETRON HCL 4 MG PO TABS: 4.0000 mg | ORAL_TABLET | Freq: Four times a day (QID) | ORAL | Status: DC | PRN

## 2018-03-26 MED ORDER — METOCLOPRAMIDE HCL 10 MG PO TABS: 5.0000 mg | ORAL_TABLET | Freq: Three times a day (TID) | ORAL | Status: DC | PRN

## 2018-03-26 MED ORDER — ONDANSETRON HCL 4 MG/2ML IJ SOLN
INTRAMUSCULAR | Status: AC
  Filled 2018-03-26: qty 2

## 2018-03-26 MED ORDER — METOCLOPRAMIDE HCL 5 MG/ML IJ SOLN: 5.0000 mg | Freq: Three times a day (TID) | INTRAMUSCULAR | Status: DC | PRN

## 2018-03-26 MED ORDER — PROPOFOL 10 MG/ML IV BOLUS
INTRAVENOUS | Status: DC | PRN
  Administered 2018-03-26: 150 mg via INTRAVENOUS

## 2018-03-26 MED ORDER — PROPOFOL 10 MG/ML IV BOLUS
INTRAVENOUS | Status: AC
  Filled 2018-03-26: qty 20

## 2018-03-26 MED ORDER — FENTANYL CITRATE (PF) 100 MCG/2ML IJ SOLN
INTRAMUSCULAR | Status: DC | PRN
  Administered 2018-03-26 (×2): 50 ug via INTRAVENOUS

## 2018-03-26 MED ORDER — MIDAZOLAM HCL 2 MG/2ML IJ SOLN
INTRAMUSCULAR | Status: AC
  Filled 2018-03-26: qty 2

## 2018-03-26 MED ORDER — POTASSIUM CHLORIDE IN NACL 20-0.9 MEQ/L-% IV SOLN
INTRAVENOUS | Status: DC
  Filled 2018-03-26: qty 1000

## 2018-03-26 MED ORDER — KETAMINE HCL 50 MG/ML IJ SOLN
INTRAMUSCULAR | Status: DC | PRN
  Administered 2018-03-26: 25 mg via INTRAMUSCULAR

## 2018-03-26 MED ORDER — NEOMYCIN-POLYMYXIN B GU 40-200000 IR SOLN
Status: DC | PRN
  Administered 2018-03-26: 2 mL

## 2018-03-26 MED ORDER — DEXAMETHASONE SODIUM PHOSPHATE 10 MG/ML IJ SOLN
INTRAMUSCULAR | Status: DC | PRN
  Administered 2018-03-26: 5 mg via INTRAVENOUS

## 2018-03-26 MED ORDER — MIDAZOLAM HCL 5 MG/5ML IJ SOLN
INTRAMUSCULAR | Status: DC | PRN
  Administered 2018-03-26: 2 mg via INTRAVENOUS

## 2018-03-26 MED ORDER — PHENYLEPHRINE HCL 10 MG/ML IJ SOLN
INTRAMUSCULAR | Status: DC | PRN
  Administered 2018-03-26 (×3): 100 ug via INTRAVENOUS

## 2018-03-26 MED ORDER — HYDROCODONE-ACETAMINOPHEN 5-325 MG PO TABS: 1.0000 | ORAL_TABLET | ORAL | Status: DC | PRN

## 2018-03-26 MED ORDER — FENTANYL CITRATE (PF) 100 MCG/2ML IJ SOLN
25.0000 ug | INTRAMUSCULAR | Status: DC | PRN
  Administered 2018-03-26: 25 ug via INTRAVENOUS
  Administered 2018-03-26: 50 ug via INTRAVENOUS
  Administered 2018-03-26: 25 ug via INTRAVENOUS

## 2018-03-26 MED ORDER — PROMETHAZINE HCL 25 MG/ML IJ SOLN: 6.2500 mg | INTRAMUSCULAR | Status: DC | PRN

## 2018-03-26 SURGICAL SUPPLY — 54 items
ANCHOR JUGGERKNOT 1.0 1DR 2-0 (Anchor) ×3 IMPLANT
ANCHOR SUT 1.45 SZ 1 SHORT (Anchor) ×3 IMPLANT
BANDAGE ACE 4X5 VEL STRL LF (GAUZE/BANDAGES/DRESSINGS) ×3 IMPLANT
BIT DRILL JUGRKNT W/NDL BIT2.9 (DRILL) ×1 IMPLANT
BLADE SURG SZ10 CARB STEEL (BLADE) ×3 IMPLANT
BNDG COHESIVE 4X5 TAN STRL (GAUZE/BANDAGES/DRESSINGS) ×3 IMPLANT
BNDG ESMARK 4X12 TAN STRL LF (GAUZE/BANDAGES/DRESSINGS) ×3 IMPLANT
CANISTER SUCT 1200ML W/VALVE (MISCELLANEOUS) ×3 IMPLANT
CHLORAPREP W/TINT 26ML (MISCELLANEOUS) ×6 IMPLANT
CLOSURE WOUND 1/4X4 (GAUZE/BANDAGES/DRESSINGS) ×1
CORD BIP STRL DISP 12FT (MISCELLANEOUS) ×3 IMPLANT
CUFF TOURN 18 STER (MISCELLANEOUS) ×3 IMPLANT
CUFF TOURN 24 STER (MISCELLANEOUS) IMPLANT
DRAPE SURG 17X11 SM STRL (DRAPES) ×3 IMPLANT
DRILL JUGGERKNOT W/NDL BIT 2.9 (DRILL) ×3
ELECT REM PT RETURN 9FT ADLT (ELECTROSURGICAL) ×3
ELECTRODE REM PT RTRN 9FT ADLT (ELECTROSURGICAL) ×1 IMPLANT
FORCEPS JEWEL BIP 4-3/4 STR (INSTRUMENTS) ×3 IMPLANT
GAUZE PETRO XEROFOAM 1X8 (MISCELLANEOUS) ×3 IMPLANT
GAUZE SPONGE 4X4 12PLY STRL (GAUZE/BANDAGES/DRESSINGS) ×3 IMPLANT
GLOVE BIO SURGEON STRL SZ8 (GLOVE) ×6 IMPLANT
GLOVE INDICATOR 8.0 STRL GRN (GLOVE) ×3 IMPLANT
GOWN STRL REUS W/ TWL LRG LVL3 (GOWN DISPOSABLE) ×1 IMPLANT
GOWN STRL REUS W/ TWL XL LVL3 (GOWN DISPOSABLE) ×1 IMPLANT
GOWN STRL REUS W/TWL LRG LVL3 (GOWN DISPOSABLE) ×2
GOWN STRL REUS W/TWL XL LVL3 (GOWN DISPOSABLE) ×2
KIT TURNOVER KIT A (KITS) ×3 IMPLANT
LABEL OR SOLS (LABEL) ×3 IMPLANT
LOOP RED MAXI  1X406MM (MISCELLANEOUS) ×2
LOOP VESSEL MAXI 1X406 RED (MISCELLANEOUS) ×1 IMPLANT
NDL SAFETY ECLIPSE 18X1.5 (NEEDLE) ×1 IMPLANT
NEEDLE HYPO 18GX1.5 SHARP (NEEDLE) ×2
NS IRRIG 1000ML POUR BTL (IV SOLUTION) ×3 IMPLANT
PACK EXTREMITY ARMC (MISCELLANEOUS) ×3 IMPLANT
PAD CAST CTTN 4X4 STRL (SOFTGOODS) IMPLANT
PADDING CAST COTTON 4X4 STRL (SOFTGOODS)
SLING ARM LRG DEEP (SOFTGOODS) IMPLANT
SLING ARM M TX990204 (SOFTGOODS) IMPLANT
SPLINT WRIST LG LT TX990309 (SOFTGOODS) IMPLANT
SPLINT WRIST LG RT TX900304 (SOFTGOODS) IMPLANT
SPLINT WRIST M LT TX990308 (SOFTGOODS) IMPLANT
SPLINT WRIST M RT TX990303 (SOFTGOODS) IMPLANT
SPONGE LAP 18X18 5 PK (GAUZE/BANDAGES/DRESSINGS) ×3 IMPLANT
STAPLER SKIN PROX 35W (STAPLE) ×3 IMPLANT
STOCKINETTE IMPERVIOUS 9X36 MD (GAUZE/BANDAGES/DRESSINGS) ×3 IMPLANT
STRAP SAFETY 5IN WIDE (MISCELLANEOUS) ×3 IMPLANT
STRIP CLOSURE SKIN 1/4X4 (GAUZE/BANDAGES/DRESSINGS) ×2 IMPLANT
SUT PROLENE 4 0 PS 2 18 (SUTURE) ×3 IMPLANT
SUT VIC AB 0 CT2 27 (SUTURE) ×3 IMPLANT
SUT VIC AB 2-0 CT1 (SUTURE) ×3 IMPLANT
SUT VIC AB 4-0 SH 27 (SUTURE) ×2
SUT VIC AB 4-0 SH 27XANBCTRL (SUTURE) ×1 IMPLANT
SUT VICRYL+ 3-0 36IN CT-1 (SUTURE) ×3 IMPLANT
SYR 10ML LL (SYRINGE) ×3 IMPLANT

## 2018-03-26 NOTE — Anesthesia Post-op Follow-up Note (Signed)
Anesthesia QCDR form completed.        

## 2018-03-26 NOTE — Op Note (Signed)
03/26/2018  3:27 PM  Patient:   Nicholas Knapp  Pre-Op Diagnosis:   Chronic lateral epicondylitis, right elbow.  Post-Op Diagnosis:   Same.  Procedure:   Debridement/repair of common extensor origin, right elbow.  Surgeon:   Maryagnes AmosJ. Jeffrey Shaquandra Galano, MD  Assistant:   None  Anesthesia:   General LMA  Findings:   As above.  Complications:   None  EBL:   <5 cc  Fluids:   700 cc crystalloid  TT:   33 minutes at 250 mmHg  Drains:   None  Closure:   4-0 Vicryl subcuticular sutures  Implants:   Biomet JuggerKnot 1.4 mm anchor x1  Brief Clinical Note:   The patient is a 62 year old male with a history of lateral sided right elbow pain. His symptoms have persisted despite medications, activity modification, injections, etc. His history and examination were consistent with chronic lateral epicondylitis. [name] for debridement and repair of the common extensor origin of his left elbow.  Procedure:   The patient was brought into the operating room and lain in the supine position. After adequate general laryngal mask anesthesia was obtained, the patient's right upper extremity was prepped with ChloraPrep solution before being draped sterilely. Preoperative antibiotics were administered. A timeout was performed to verify the appropriate surgical site before the limb was exsanguinated with an Esmarch and the tourniquet inflated to 250 mmHg. An approximately 4-5 cm incision was made over the lateral aspect of the elbow beginning at the lateral epicondyle and extending distally in line with the common extensor origin tendons. The incision was carried down through the subcutaneous tissues to expose the common extensor origin. The extensor carpi radialis brevis tendon was identified and incised in line with the incision. The areas of significantly degenerative tendinous tissues were debrided sharply with a #15 blade down to the epicondylar region. The bone itself was roughened  with a rongeur to provide a good bleeding surface for reattachment of the tendon. A Biomet 1.4 mm JuggerKnot anchor was inserted into the exposed bone of the lateral epicondyle. The sutures were passed through the tendon and tied securely to effect the repair.   The wound was copiously irrigated with bacitracin saline solution using bulb irrigation before several 2-0 Vicryl interrupted sutures were used to close the longitudinal incision in the tendon in a side-to-side fashion. The subcutaneous tissues were closed using 3-0 Vicryl interrupted sutures before the skin was closed using 4-0 Vicryl subcuticular sutures. Benzoin and Steri-Strips were applied to the skin. A total of 10 cc of 0.5% plain Sensorcaine was injected in and around the incision to help with postoperative analgesia before a sterile bulky dressing was applied to the elbow. A Velcro wrist immobilizer was applied before the patient was awakened, extubated, and returned to the recovery room in satisfactory condition after tolerating the procedure well.

## 2018-03-26 NOTE — Anesthesia Procedure Notes (Signed)
Procedure Name: LMA Insertion Performed by: Yomar Mejorado, CRNA Pre-anesthesia Checklist: Patient identified, Patient being monitored, Timeout performed, Emergency Drugs available and Suction available Patient Re-evaluated:Patient Re-evaluated prior to induction Oxygen Delivery Method: Circle system utilized Preoxygenation: Pre-oxygenation with 100% oxygen Induction Type: IV induction Ventilation: Mask ventilation without difficulty LMA: LMA inserted LMA Size: 4.0 Tube type: Oral Number of attempts: 1 Placement Confirmation: positive ETCO2 and breath sounds checked- equal and bilateral Tube secured with: Tape Dental Injury: Teeth and Oropharynx as per pre-operative assessment        

## 2018-03-26 NOTE — Transfer of Care (Signed)
Immediate Anesthesia Transfer of Care Note  Patient: Nicholas Knapp  Procedure(s) Performed: DEBRIDEMENT OF THE COMMON EXTENSOR ORIGIN OF ELBOW (Right Elbow)  Patient Location: PACU  Anesthesia Type:General  Level of Consciousness: sedated  Airway & Oxygen Therapy: Patient Spontanous Breathing and Patient connected to face mask oxygen  Post-op Assessment: Report given to RN and Post -op Vital signs reviewed and stable  Post vital signs: Reviewed  Last Vitals:  Vitals Value Taken Time  BP 100/58 03/26/2018 12:09 PM  Temp    Pulse 65 03/26/2018 12:10 PM  Resp 11 03/26/2018 12:10 PM  SpO2 99 % 03/26/2018 12:10 PM  Vitals shown include unvalidated device data.  Last Pain:  Vitals:   03/26/18 1015  TempSrc: Tympanic  PainSc: 0-No pain         Complications: No apparent anesthesia complications

## 2018-03-26 NOTE — Discharge Instructions (Addendum)
Orthopedic discharge instructions: °Keep dressing dry and intact. °Keep hand elevated above heart level. °May shower after dressing removed on postop day 4 (Monday). °Cover sutures with Band-Aids after drying off. °Apply ice to affected area frequently. °Take ibuprofen 600 mg TID with meals for 7-10 days, then as necessary. °Take ES Tylenol or pain medication as prescribed when needed.  °Return for follow-up in 10-14 days or as scheduled. ° ° °AMBULATORY SURGERY  °DISCHARGE INSTRUCTIONS ° ° °1) The drugs that you were given will stay in your system until tomorrow so for the next 24 hours you should not: ° °A) Drive an automobile °B) Make any legal decisions °C) Drink any alcoholic beverage ° ° °2) You may resume regular meals tomorrow.  Today it is better to start with liquids and gradually work up to solid foods. ° °You may eat anything you prefer, but it is better to start with liquids, then soup and crackers, and gradually work up to solid foods. ° ° °3) Please notify your doctor immediately if you have any unusual bleeding, trouble breathing, redness and pain at the surgery site, drainage, fever, or pain not relieved by medication. ° ° ° °4) Additional Instructions: ° ° ° ° ° ° ° °Please contact your physician with any problems or Same Day Surgery at [phone], Monday through Friday 6 am to 4 pm, or [place] at [place] Main number at [phone]. °

## 2018-03-26 NOTE — Anesthesia Preprocedure Evaluation (Signed)
Anesthesia Evaluation  Patient identified by MRN, date of birth, ID band Patient awake    Reviewed: Allergy & Precautions, NPO status , Patient's Chart, lab work & pertinent test results  History of Anesthesia Complications Negative for: history of anesthetic complications  Airway Mallampati: III  TM Distance: >3 FB Neck ROM: Full    Dental no notable dental hx.    Pulmonary asthma , sleep apnea , former smoker,    breath sounds clear to auscultation- rhonchi (-) wheezing      Cardiovascular hypertension, Pt. on medications (-) angina+ CAD, + Past MI, + Cardiac Stents (2009) and + CABG (1995)   Rhythm:Regular Rate:Normal - Systolic murmurs and - Diastolic murmurs    Neuro/Psych PSYCHIATRIC DISORDERS Anxiety Depression negative neurological ROS     GI/Hepatic Neg liver ROS, GERD  ,  Endo/Other  diabetes, Oral Hypoglycemic Agents  Renal/GU negative Renal ROS     Musculoskeletal negative musculoskeletal ROS (+)   Abdominal (+) - obese,   Peds  Hematology negative hematology ROS (+)   Anesthesia Other Findings Past Medical History: No date: Allergic state No date: Anxiety No date: Asthma No date: Coronary artery disease No date: Depression No date: Diabetes mellitus without complication (HCC) No date: Diverticulosis No date: GERD (gastroesophageal reflux disease) No date: Hyperlipemia No date: Hypertension No date: Midsystolic murmur 1995 and 2008: Myocardial infarction (HCC) No date: Prostatitis No date: Proteinuria No date: Sleep apnea   Reproductive/Obstetrics                             Anesthesia Physical Anesthesia Plan  ASA: III  Anesthesia Plan: General   Post-op Pain Management:    Induction: Intravenous  PONV Risk Score and Plan: 1 and Ondansetron and Midazolam  Airway Management Planned: LMA  Additional Equipment:   Intra-op Plan:   Post-operative Plan:    Informed Consent: I have reviewed the patients History and Physical, chart, labs and discussed the procedure including the risks, benefits and alternatives for the proposed anesthesia with the patient or authorized representative who has indicated his/her understanding and acceptance.   Dental advisory given  Plan Discussed with: CRNA and Anesthesiologist  Anesthesia Plan Comments:         Anesthesia Quick Evaluation

## 2018-03-26 NOTE — Anesthesia Postprocedure Evaluation (Signed)
Anesthesia Post Note  Patient: Nicholas Knapp  Procedure(s) Performed: DEBRIDEMENT OF THE COMMON EXTENSOR ORIGIN OF ELBOW (Right Elbow)  Patient location during evaluation: PACU Anesthesia Type: General Level of consciousness: awake and alert and oriented Pain management: pain level controlled Vital Signs Assessment: post-procedure vital signs reviewed and stable Respiratory status: spontaneous breathing, nonlabored ventilation and respiratory function stable Cardiovascular status: blood pressure returned to baseline and stable Postop Assessment: no signs of nausea or vomiting Anesthetic complications: no     Last Vitals:  Vitals:   03/26/18 1240 03/26/18 1255  BP: 116/80 116/77  Pulse: 79 81  Resp: (!) 9 17  Temp:    SpO2: 98% 97%    Last Pain:  Vitals:   03/26/18 1255  TempSrc:   PainSc: 3                  Loralee Weitzman

## 2018-03-26 NOTE — H&P (Signed)
Paper H&P to be scanned into permanent record. H&P reviewed and patient re-examined. No changes. 

## 2018-03-27 ENCOUNTER — Encounter: Payer: Self-pay | Admitting: Surgery

## 2018-06-02 ENCOUNTER — Other Ambulatory Visit
Admission: RE | Admit: 2018-06-02 | Discharge: 2018-06-02 | Disposition: A | Discharge status: Home / Self Care | Payer: Commercial Managed Care - PPO | Source: Ambulatory Visit | Attending: Internal Medicine | Admitting: Internal Medicine

## 2018-06-02 DIAGNOSIS — I25810 Atherosclerosis of coronary artery bypass graft(s) without angina pectoris: Secondary | ICD-10-CM | POA: Diagnosis not present

## 2018-06-02 LAB — TROPONIN I

## 2019-01-20 DIAGNOSIS — S46911A Strain of unspecified muscle, fascia and tendon at shoulder and upper arm level, right arm, initial encounter: Secondary | ICD-10-CM | POA: Insufficient documentation

## 2019-01-20 DIAGNOSIS — M7581 Other shoulder lesions, right shoulder: Secondary | ICD-10-CM | POA: Insufficient documentation

## 2019-03-31 ENCOUNTER — Other Ambulatory Visit: Payer: Self-pay | Admitting: Surgery

## 2019-03-31 DIAGNOSIS — S46911D Strain of unspecified muscle, fascia and tendon at shoulder and upper arm level, right arm, subsequent encounter: Secondary | ICD-10-CM

## 2019-03-31 DIAGNOSIS — M7581 Other shoulder lesions, right shoulder: Secondary | ICD-10-CM

## 2019-05-04 ENCOUNTER — Ambulatory Visit: Payer: Commercial Managed Care - PPO

## 2019-05-18 ENCOUNTER — Ambulatory Visit: Payer: Commercial Managed Care - PPO

## 2020-01-21 DIAGNOSIS — M47816 Spondylosis without myelopathy or radiculopathy, lumbar region: Secondary | ICD-10-CM | POA: Insufficient documentation

## 2020-07-10 DIAGNOSIS — Z955 Presence of coronary angioplasty implant and graft: Secondary | ICD-10-CM | POA: Insufficient documentation

## 2020-07-17 ENCOUNTER — Other Ambulatory Visit: Payer: Self-pay | Admitting: Student

## 2020-07-17 DIAGNOSIS — G8929 Other chronic pain: Secondary | ICD-10-CM

## 2020-07-18 ENCOUNTER — Other Ambulatory Visit: Payer: Self-pay

## 2020-07-18 ENCOUNTER — Ambulatory Visit
Admission: RE | Admit: 2020-07-18 | Discharge: 2020-07-18 | Disposition: A | Discharge status: Home / Self Care | Payer: Medicare Other | Source: Ambulatory Visit | Attending: Student | Admitting: Student

## 2020-07-18 DIAGNOSIS — M5442 Lumbago with sciatica, left side: Secondary | ICD-10-CM | POA: Insufficient documentation

## 2020-07-18 DIAGNOSIS — G8929 Other chronic pain: Secondary | ICD-10-CM | POA: Diagnosis present

## 2020-07-19 ENCOUNTER — Other Ambulatory Visit: Payer: Self-pay | Admitting: Student

## 2020-07-19 DIAGNOSIS — M5417 Radiculopathy, lumbosacral region: Secondary | ICD-10-CM

## 2020-07-27 ENCOUNTER — Other Ambulatory Visit: Payer: Self-pay

## 2020-07-27 ENCOUNTER — Emergency Department
Admission: EM | Admit: 2020-07-27 | Discharge: 2020-07-27 | Disposition: A | Discharge status: Home / Self Care | Payer: Medicare Other | Attending: Emergency Medicine | Admitting: Emergency Medicine

## 2020-07-27 ENCOUNTER — Encounter: Payer: Self-pay | Admitting: Emergency Medicine

## 2020-07-27 ENCOUNTER — Other Ambulatory Visit: Payer: Self-pay | Admitting: Physician Assistant

## 2020-07-27 DIAGNOSIS — I10 Essential (primary) hypertension: Secondary | ICD-10-CM | POA: Insufficient documentation

## 2020-07-27 DIAGNOSIS — J45909 Unspecified asthma, uncomplicated: Secondary | ICD-10-CM | POA: Insufficient documentation

## 2020-07-27 DIAGNOSIS — E119 Type 2 diabetes mellitus without complications: Secondary | ICD-10-CM | POA: Insufficient documentation

## 2020-07-27 DIAGNOSIS — Z87891 Personal history of nicotine dependence: Secondary | ICD-10-CM | POA: Diagnosis not present

## 2020-07-27 DIAGNOSIS — M545 Low back pain, unspecified: Secondary | ICD-10-CM

## 2020-07-27 DIAGNOSIS — M5441 Lumbago with sciatica, right side: Secondary | ICD-10-CM | POA: Insufficient documentation

## 2020-07-27 DIAGNOSIS — I2510 Atherosclerotic heart disease of native coronary artery without angina pectoris: Secondary | ICD-10-CM | POA: Insufficient documentation

## 2020-07-27 DIAGNOSIS — M5416 Radiculopathy, lumbar region: Secondary | ICD-10-CM | POA: Insufficient documentation

## 2020-07-27 MED ORDER — KETOROLAC TROMETHAMINE 10 MG PO TABS: 10.0000 mg | ORAL_TABLET | Freq: Four times a day (QID) | ORAL | 0 refills | Status: DC | PRN

## 2020-07-27 MED ORDER — HYDROMORPHONE HCL 1 MG/ML IJ SOLN
1.0000 mg | Freq: Once | INTRAMUSCULAR | Status: AC
  Administered 2020-07-27: 1 mg via INTRAVENOUS
  Filled 2020-07-27: qty 1

## 2020-07-27 MED ORDER — OXYCODONE-ACETAMINOPHEN 7.5-325 MG PO TABS: 1.0000 | ORAL_TABLET | Freq: Four times a day (QID) | ORAL | 0 refills | Status: DC | PRN

## 2020-07-27 MED ORDER — CYCLOBENZAPRINE HCL 10 MG PO TABS: 10.0000 mg | ORAL_TABLET | Freq: Three times a day (TID) | ORAL | 0 refills | Status: DC | PRN

## 2020-07-27 MED ORDER — KETOROLAC TROMETHAMINE 30 MG/ML IJ SOLN
30.0000 mg | Freq: Once | INTRAMUSCULAR | Status: AC
  Administered 2020-07-27: 30 mg via INTRAVENOUS
  Filled 2020-07-27: qty 1

## 2020-07-27 MED ORDER — ORPHENADRINE CITRATE 30 MG/ML IJ SOLN
60.0000 mg | Freq: Once | INTRAMUSCULAR | Status: AC
  Administered 2020-07-27: 60 mg via INTRAVENOUS
  Filled 2020-07-27: qty 2

## 2020-07-27 NOTE — Discharge Instructions (Addendum)
Follow discharge care instruction take medication as directed.  Be advised medication will cause drowsiness.  Inform your treating doctor the name of the medications you are prescribed today.

## 2020-07-27 NOTE — ED Triage Notes (Signed)
Pt reports has a herniated disc and went to his MD today to get a spinal injection but it was not helpful and made the pain worse. Pt reports has been dealing with this issue for a few weeks and it is difficult to get out of bed and walk. Denies loss of bladder or bowel.

## 2020-07-27 NOTE — ED Provider Notes (Signed)
[facility] Emergency Department Provider Note   ____________________________________________   First MD Initiated Contact with Patient 07/27/20 1224     (approximate)  I have reviewed the triage vital signs and the nursing notes.   HISTORY  Chief Complaint Back Pain    HPI Nicholas Knapp is a 64 y.o. male patient arrived from the physical medicine department of the [facility] clinic status post epidural injection for herniated disc, consisting of lidocaine and Decadron.  Patient states procedure increased his pain.  Patient received his diagnosis 2 weeks ago after almost a month of radicular back pain to the lower extremities.  Patient denies bladder bowel dysfunction.  Patient rates his pain as a 10/10.        Past Medical History:  Diagnosis Date  . Allergic state   . Anxiety   . Asthma   . Coronary artery disease   . Depression   . Diabetes mellitus without complication (HCC)   . Diverticulosis   . GERD (gastroesophageal reflux disease)   . Hyperlipemia   . Hypertension   . Midsystolic murmur   . Myocardial infarction [facility]) 1995 and 2008  . Prostatitis   . Proteinuria   . Sleep apnea     There are no problems to display for this patient.   Past Surgical History:  Procedure Laterality Date  . COLON SURGERY  2008   6 inches removed due to diverticulosis  . COLONOSCOPY WITH PROPOFOL N/A 09/13/2016   Procedure: COLONOSCOPY WITH PROPOFOL;  Surgeon: Scot Jun, MD;  Location: [facility] ENDOSCOPY;  Service: Endoscopy;  Laterality: N/A;  . CORONARY ANGIOPLASTY     6 stents on 2 separate procedures  . CORONARY ARTERY BYPASS GRAFT  09/28/1994   2 vessels  . double bipass    . ESOPHAGOGASTRODUODENOSCOPY  11/19/2013  . sinus sugery    . TENDON RECONSTRUCTION Right 03/26/2018   Procedure: DEBRIDEMENT OF THE COMMON EXTENSOR ORIGIN OF ELBOW;  Surgeon: Christena Flake, MD;  Location: [facility] ORS;  Service: Orthopedics;  Laterality: Right;  .  TONSILLECTOMY      Prior to Admission medications   Medication Sig Start Date End Date Taking? Authorizing Provider  aspirin 81 MG tablet Take 81 mg by mouth every evening.     [provider]  cyclobenzaprine (FLEXERIL) 10 MG tablet Take 1 tablet (10 mg total) by mouth 3 (three) times daily as needed. 07/27/20   Joni Reining, PA-C  diphenhydrAMINE (BENADRYL) 25 MG tablet Take 25 mg by mouth daily as needed for allergies.    [provider]  DULoxetine (CYMBALTA) 60 MG capsule Take 60 mg by mouth daily.    [provider]  EPINEPHrine (EPIPEN 2-PAK) 0.3 mg/0.3 mL IJ SOAJ injection Inject into the muscle once.    [provider]  glimepiride (AMARYL) 2 MG tablet Take 2 mg by mouth daily with breakfast.    [provider]  glucose blood test strip 1 each by Other route as needed for other. Use as instructed    [provider]  ketorolac (TORADOL) 10 MG tablet Take 1 tablet (10 mg total) by mouth every 6 (six) hours as needed. 07/27/20   Joni Reining, PA-C  losartan (COZAAR) 25 MG tablet Take 25 mg by mouth every evening.     [provider]  metoprolol succinate (TOPROL-XL) 25 MG 24 hr tablet Take 25 mg by mouth every evening.     [provider]  nitroGLYCERIN (NITROSTAT) 0.4  MG SL tablet Place 0.4 mg under the tongue every 5 (five) minutes as needed for chest pain.    [provider]  oxyCODONE-acetaminophen (PERCOCET) 7.5-325 MG tablet Take 1 tablet by mouth every 6 (six) hours as needed. 07/27/20   Joni Reining, PA-C  pantoprazole (PROTONIX) 40 MG tablet Take 40 mg by mouth 2 (two) times daily.     [provider]  polycarbophil (FIBERCON) 625 MG tablet Take 625 mg by mouth every evening.    [provider]  rosuvastatin (CRESTOR) 20 MG tablet Take 20 mg by mouth every evening.     [provider]  traZODone (DESYREL) 50 MG tablet Take 50 mg by mouth at bedtime as needed for sleep.     [provider]    Allergies Bee venom, Ace inhibitors, Isosorbide, Lamotrigine, Metformin and related, Bupropion, and Crestor [rosuvastatin calcium]  Family History  Problem Relation Age of Onset  . Cancer Mother   . Heart disease Mother   . Cancer Father   . Heart disease Father     Social History Social History   Tobacco Use  . Smoking status: Former Smoker    Packs/day: 1.00    Years: 25.00    Pack years: 25.00    Types: Cigarettes    Quit date: 09/23/1994    Years since quitting: 25.8  . Smokeless tobacco: Never Used  Vaping Use  . Vaping Use: Never used  Substance Use Topics  . Alcohol use: Yes    Comment: occassional  . Drug use: No    Review of Systems Constitutional: No fever/chills Eyes: No visual changes. ENT: No sore throat. Cardiovascular: Denies chest pain. Respiratory: Denies shortness of breath. Gastrointestinal: No abdominal pain.  No nausea, no vomiting.  No diarrhea.  No constipation. Genitourinary: Negative for dysuria. Musculoskeletal: Positive for back pain. Skin: Negative for rash. Neurological: Negative for headaches, focal weakness or numbness. Psychiatric:  Anxiety /depression Endocrine:  Diabetes, hyperlipidemia, and hypertension. Allergic/Immunilogical: See extensive allergy list. ____________________________________________   PHYSICAL EXAM:  VITAL SIGNS: ED Triage Vitals  Enc Vitals Group     BP 07/27/20 1057 102/85     Pulse Rate 07/27/20 1057 (!) 52     Resp 07/27/20 1057 20     Temp 07/27/20 1057 97.9 F (36.6 C)     Temp Source 07/27/20 1057 Oral     SpO2 07/27/20 1057 97 %     Weight 07/27/20 1056 200 lb (90.7 kg)     Height 07/27/20 1056 5\' 9"  (1.753 m)     Head Circumference --      Peak Flow --      Pain Score 07/27/20 1055 10     Pain Loc --      Pain Edu? --      Excl. in GC? --     Constitutional: Alert and oriented.  Moderate distress.   Neck: No cervical spine tenderness to  palpation. Cardiovascular: Bradycardic, regular rhythm. Grossly normal heart sounds.  Good peripheral circulation. Respiratory: Normal respiratory effort.  No retractions. Lungs CTAB. Gastrointestinal: Soft and nontender. No distention. No abdominal bruits. No CVA tenderness. Genitourinary: Deferred Musculoskeletal: No obvious lumbar deformity.  Patient has moderate guarding palpation of L3-S1.  Unable to perform straight leg test secondary to the patient  prone position.  No lower extremity tenderness nor edema.  No joint effusions. Neurologic:  Normal speech and language. No gross focal neurologic deficits are appreciated. No gait instability. Skin:  Skin is warm,  dry and intact. No rash noted. Psychiatric: Mood and affect are normal. Speech and behavior are normal.  ____________________________________________   LABS (all labs ordered are listed, but only abnormal results are displayed)  Labs Reviewed - No data to display ____________________________________________  EKG   ____________________________________________  RADIOLOGY  ED MD interpretation:    Official radiology report(s): No results found.  ____________________________________________   PROCEDURES  Procedure(s) performed (including Critical Care):  Procedures   ____________________________________________   INITIAL IMPRESSION / ASSESSMENT AND PLAN / ED COURSE  As part of my medical decision making, I reviewed the following data within the electronic MEDICAL RECORD NUMBER     Patient presents with excruciating back pain secondary to epidural procedure prior to arrival to this facility.  Patient given IV Dilaudid, Norflex, and Toradol.  Patient states pain has decreased from a 10/10 to  4/10.  Patient given discharge care instruction and advised to follow-up with treating doctor at the [facility] clinic.          ____________________________________________   FINAL CLINICAL IMPRESSION(S) / ED  DIAGNOSES  Final diagnoses:  Midline low back pain, unspecified chronicity, unspecified whether sciatica present     ED Discharge Orders         Ordered    oxyCODONE-acetaminophen (PERCOCET) 7.5-325 MG tablet  Every 6 hours PRN        07/27/20 1322    cyclobenzaprine (FLEXERIL) 10 MG tablet  3 times daily PRN        07/27/20 1322    ketorolac (TORADOL) 10 MG tablet  Every 6 hours PRN        07/27/20 1322           Note:  This document was prepared using Dragon voice recognition software and may include unintentional dictation errors.    Joni Reining, PA-C 07/27/20 1329    Sharyn Creamer, MD 07/27/20 [phone]

## 2020-07-27 NOTE — ED Triage Notes (Signed)
First Nurse Note:  Arrives via [facility] c/o back pain.  Given an injection of steroid and lidocaine at [facility], makes pain worse.   Recent cardiac stents.  Patient has herniated disc.

## 2020-07-27 NOTE — ED Notes (Signed)
See triage note.Presents with lower back pain States pain is mainly to left and moves into left leg  Per wife he had a spine injection last week  Pain became more severe

## 2020-07-28 ENCOUNTER — Other Ambulatory Visit: Payer: Self-pay | Admitting: Nurse Practitioner

## 2020-07-28 ENCOUNTER — Other Ambulatory Visit (HOSPITAL_COMMUNITY): Payer: Self-pay | Admitting: Nurse Practitioner

## 2020-07-28 DIAGNOSIS — M5416 Radiculopathy, lumbar region: Secondary | ICD-10-CM

## 2020-08-01 ENCOUNTER — Other Ambulatory Visit: Payer: Self-pay

## 2020-08-01 ENCOUNTER — Ambulatory Visit
Admission: RE | Admit: 2020-08-01 | Discharge: 2020-08-01 | Disposition: A | Discharge status: Home / Self Care | Payer: Medicare Other | Source: Ambulatory Visit | Attending: Nurse Practitioner | Admitting: Nurse Practitioner

## 2020-08-01 DIAGNOSIS — M5416 Radiculopathy, lumbar region: Secondary | ICD-10-CM | POA: Diagnosis present

## 2020-08-23 ENCOUNTER — Other Ambulatory Visit: Payer: Self-pay

## 2020-08-23 ENCOUNTER — Encounter: Payer: Self-pay | Admitting: Student in an Organized Health Care Education/Training Program

## 2020-08-23 ENCOUNTER — Ambulatory Visit
Payer: Medicare Other | Attending: Student in an Organized Health Care Education/Training Program | Admitting: Student in an Organized Health Care Education/Training Program

## 2020-08-23 VITALS — BP 118/68 | HR 94 | Temp 97.9°F | Resp 16 | Ht 69.0 in | Wt 192.0 lb

## 2020-08-23 DIAGNOSIS — G894 Chronic pain syndrome: Secondary | ICD-10-CM | POA: Insufficient documentation

## 2020-08-23 DIAGNOSIS — M9973 Connective tissue and disc stenosis of intervertebral foramina of lumbar region: Secondary | ICD-10-CM | POA: Diagnosis present

## 2020-08-23 DIAGNOSIS — M5416 Radiculopathy, lumbar region: Secondary | ICD-10-CM | POA: Insufficient documentation

## 2020-08-23 DIAGNOSIS — I25810 Atherosclerosis of coronary artery bypass graft(s) without angina pectoris: Secondary | ICD-10-CM | POA: Insufficient documentation

## 2020-08-23 DIAGNOSIS — M48061 Spinal stenosis, lumbar region without neurogenic claudication: Secondary | ICD-10-CM

## 2020-08-23 DIAGNOSIS — Z955 Presence of coronary angioplasty implant and graft: Secondary | ICD-10-CM | POA: Diagnosis present

## 2020-08-23 DIAGNOSIS — I25119 Atherosclerotic heart disease of native coronary artery with unspecified angina pectoris: Secondary | ICD-10-CM | POA: Diagnosis present

## 2020-08-23 MED ORDER — GABAPENTIN 300 MG PO CAPS: 600.0000 mg | ORAL_CAPSULE | Freq: Three times a day (TID) | ORAL | 1 refills | Status: DC

## 2020-08-23 NOTE — Patient Instructions (Addendum)
1. Starting tomorrow gabapentin will be as follows: 300 mg in AM, 600 mg afternoon, 600 mg night. Do this for 1-2 weeks.  2. Then increase to 600 mg morning, 600 mg afternoon, 600 mg night  3. Follow up in 6 weeks.

## 2020-08-23 NOTE — Progress Notes (Signed)
Safety precautions to be maintained throughout the outpatient stay will include: orient to surroundings, keep bed in low position, maintain call bell within reach at all times, provide assistance with transfer out of bed and ambulation.  

## 2020-08-23 NOTE — Progress Notes (Signed)
Patient: Nicholas Knapp  Service Category: E/M  Provider: Gillis Santa, MD  DOB: 07/25/1956  DOS: 08/23/2020  Referring Provider: Adin Hector, MD  MRN: [id number]  Setting: Ambulatory outpatient  PCP: Adin Hector, MD  Type: New Patient  Specialty: Interventional Pain Management    Location: Office  Delivery: Face-to-face     Primary Reason(s) for Visit: Encounter for initial evaluation of one or more chronic problems (new to examiner) potentially causing chronic pain, and posing a threat to normal musculoskeletal function. (Level of risk: High) CC: Leg Pain (right lower)  HPI  [name] is a 64 y.o. year old, male patient, who comes for the first time to our practice referred by Adin Hector, MD for our initial evaluation of his chronic pain. He has Acute right-sided low back pain with right-sided sciatica; CAD (coronary artery disease); CAD (coronary artery disease), autologous vein bypass graft; Lateral epicondylitis, left elbow; Lumbar radicular pain; Lumbar spondylosis; Rotator cuff tendinitis, right; S/P drug eluting coronary stent placement; Strain of right shoulder; Type 2 diabetes mellitus with microalbuminuria ([place]); and Neuroforaminal stenosis of lumbar spine on their problem list. Today he comes in for evaluation of his Leg Pain (right lower)  Pain Assessment: Location: Right, Lower Leg Radiating: to foot, including all toes. foot cold all the time Onset: More than a month ago Duration: Chronic pain Quality: Aching, Burning, Constant, Stabbing Severity: 3 /10 (subjective, self-reported pain score)  Effect on ADL: limits daily activity Timing: Constant Modifying factors: meds BP: 118/68  HR: 94  Onset and Duration: Gradual and Date of onset: January Cause of pain: 07/12/2020 Severity: Getting better, NAS-11 at its worse: 10/10, NAS-11 at its best: 3/10, NAS-11 now: 3/10 and NAS-11 on the average: 5/10 Timing: Night Aggravating Factors: Bending, Kneeling,  Prolonged sitting, Prolonged standing, Squatting, Stooping , Walking and Walking uphill Alleviating Factors: Lying down and Medications Associated Problems: Night-time cramps, Fatigue, Nausea, Numbness, Tingling, Weakness, Pain that wakes patient up and Pain that does not allow patient to sleep Quality of Pain: Aching, Annoying, Burning, Dull, Exhausting, Sharp, Stabbing, Throbbing, Tingling and Uncomfortable Previous Examinations or Tests: MRI scan, X-rays and Chiropractic evaluation Previous Treatments:     [name] is a 64 year old male who presents with a chief complaint of of lower back, right buttock and right lower extremity pain. He reports that the pain is traveling down from his right buttock down his posterior leg all the way down to his right foot. He reports that he has had multiple instances of this pain, for many years, however his pain flare recently has been the worst.  Patient saw Dr. Alba Destine with [facility] and underwent an L5-S1 right-sided transforaminal epidural steroid injection, however Dr. Alba Destine was concerned with the amount of pain that he was experiencing and together with his wife and himself, they made the decision to be sent to the emergency department for pain management. He reports that he received multiple medications, and initially the pain was somewhat tolerable, but it has gotten a lot worse recently.  Of note, patient had a coronary stent placed for coronary artery disease at the beginning of August and per his cardiologist, Dr. Gilberto Better to be on dual antiplatelet therapy for at least 3 months but preferably 12 months.  Patient does have a significant cardiac history with over 5 stents in place.  He is high risk to be off of his anticoagulation.  In regards to medication management, patient is on  Cymbalta 60 mg.  He is on gabapentin 300 mg 3 times daily.  He is not noticing any side effects with this medication such as sedation or lower  extremity swelling.  He was prescribed hydrocodone which he takes intermittently when he has severe pain flare.  Patient is requesting a referral to physical therapy and met been with [facility] for his right lower extremity radicular pain.  Historic Controlled Substance Pharmacotherapy Review  PMP and historical list of controlled substances: Hydrocodone 7.5 mg as needed.  Does not take this medication regularly.  Historical Monitoring: The patient  reports no history of drug use. List of all UDS Test(s): No results found for: MDMA, COCAINSCRNUR, [place], [place], CANNABQUANT, [place], [place] List of other Serum/Urine Drug Screening Test(s):  No results found for: AMPHSCRSER, BARBSCRSER, BENZOSCRSER, COCAINSCRSER, COCAINSCRNUR, PCPSCRSER, PCPQUANT, THCSCRSER, THCU, CANNABQUANT, OPIATESCRSER, OXYSCRSER, PROPOXSCRSER, ETH Historical Background Evaluation: [place] PMP: PDMP not reviewed this encounter. Online review of the past 28-monthperiod conducted.             [place] Department of public safety, offender search: (Editor, commissioningInformation) Non-contributory Risk Assessment Profile: Aberrant behavior: None observed or detected today Risk factors for fatal opioid overdose: None identified today Fatal overdose hazard ratio (HR): Calculation deferred Non-fatal overdose hazard ratio (HR): Calculation deferred Risk of opioid abuse or dependence: 0.7-3.0% with doses ? 36 MME/day and 6.1-26% with doses ? 120 MME/day. Substance use disorder (SUD) risk level: See below Personal History of Substance Abuse (SUD-Substance use disorder):  Alcohol: Negative  Illegal Drugs: Negative  Rx Drugs: Negative  ORT Risk Level calculation: Low Risk  Opioid Risk Tool - 08/23/20 1455      Family History of Substance Abuse   Alcohol Negative    Illegal Drugs Negative    Rx Drugs Negative      Personal History of Substance Abuse   Alcohol Negative    Illegal Drugs Negative    Rx Drugs Negative      Age   Age between  183-45years  No      History of Preadolescent Sexual Abuse   History of Preadolescent Sexual Abuse Negative or Male      Psychological Disease   Psychological Disease Negative    Depression Positive      Total Score   Opioid Risk Tool Scoring 1    Opioid Risk Interpretation Low Risk          ORT Scoring interpretation table:  Score <3 = Low Risk for SUD  Score between 4-7 = Moderate Risk for SUD  Score >8 = High Risk for Opioid Abuse   PHQ-2 Depression Scale:  Total score:    PHQ-2 Scoring interpretation table: (Score and probability of major depressive disorder)  Score 0 = No depression  Score 1 = 15.4% Probability  Score 2 = 21.1% Probability  Score 3 = 38.4% Probability  Score 4 = 45.5% Probability  Score 5 = 56.4% Probability  Score 6 = 78.6% Probability   PHQ-9 Depression Scale:  Total score:    PHQ-9 Scoring interpretation table:  Score 0-4 = No depression  Score 5-9 = Mild depression  Score 10-14 = Moderate depression  Score 15-19 = Moderately severe depression  Score 20-27 = Severe depression (2.4 times higher risk of SUD and 2.89 times higher risk of overuse)   Pharmacologic Plan: As per protocol, I have not taken over any controlled substance management, pending the results of ordered tests and/or consults.  Initial impression: Pending review of available data and ordered tests.  Meds   Current Outpatient Medications:  .  aspirin 81 MG tablet, Take 81 mg by mouth every evening. , Disp: , Rfl:  .  diphenhydrAMINE (BENADRYL) 25 MG tablet, Take 25 mg by mouth daily as needed for allergies., Disp: , Rfl:  .  DULoxetine (CYMBALTA) 60 MG capsule, Take 60 mg by mouth daily., Disp: , Rfl:  .  EPINEPHrine (EPIPEN 2-PAK) 0.3 mg/0.3 mL IJ SOAJ injection, Inject into the muscle once., Disp: , Rfl:  .  gabapentin (NEURONTIN) 300 MG capsule, Take 2 capsules (600 mg total) by mouth 3 (three) times daily., Disp: 180 capsule, Rfl: 1 .  glimepiride (AMARYL) 2 MG  tablet, Take 2 mg by mouth daily with breakfast., Disp: , Rfl:  .  glucose blood test strip, 1 each by Other route as needed for other. Use as instructed, Disp: , Rfl:  .  losartan (COZAAR) 25 MG tablet, Take 25 mg by mouth every evening. , Disp: , Rfl:  .  metoprolol succinate (TOPROL-XL) 25 MG 24 hr tablet, Take 25 mg by mouth every evening. , Disp: , Rfl:  .  nitroGLYCERIN (NITROSTAT) 0.4 MG SL tablet, Place 0.4 mg under the tongue every 5 (five) minutes as needed for chest pain., Disp: , Rfl:  .  pantoprazole (PROTONIX) 40 MG tablet, Take 40 mg by mouth 2 (two) times daily. , Disp: , Rfl:  .  polycarbophil (FIBERCON) 625 MG tablet, Take 625 mg by mouth every evening., Disp: , Rfl:  .  rosuvastatin (CRESTOR) 20 MG tablet, Take 20 mg by mouth every evening. , Disp: , Rfl:  .  traZODone (DESYREL) 50 MG tablet, Take 50 mg by mouth at bedtime as needed for sleep., Disp: , Rfl:   Imaging Review  Lumbosacral Imaging: Lumbar MR wo contrast: Results for orders placed during the hospital encounter of 08/01/20  MR LUMBAR SPINE WO CONTRAST  Narrative CLINICAL DATA:  Severe right buttock and leg pain. Unable to sit. Recent epidural injection.  EXAM: MRI LUMBAR SPINE WITHOUT CONTRAST  TECHNIQUE: Multiplanar, multisequence MR imaging of the lumbar spine was performed. No intravenous contrast was administered.  COMPARISON:  MRI lumbar spine dated July 18, 2020.  FINDINGS: Segmentation:  Standard.  Alignment:  Physiologic.  Vertebrae: No fracture, evidence of discitis, or suspicious bone lesion. Scattered vertebral body hemangiomas again noted.  Conus medullaris and cauda equina: Conus extends to the L2 level. Conus and cauda equina appear normal.  Paraspinal and other soft tissues: Negative.  Disc levels:  T12-L1:  Negative.  L1-L2:  Negative.  L2-L3: Unchanged small right extraforaminal/far lateral disc protrusion encroaching on the exiting right L2 nerve root. Unchanged mild  right facet arthropathy. No stenosis.  L3-L4: Unchanged mild disc bulging and bilateral facet arthropathy. Unchanged mild right lateral recess stenosis. No spinal canal or neuroforaminal stenosis.  L4-L5: Unchanged mild disc bulging and moderate bilateral facet arthropathy. Unchanged mild bilateral lateral recess stenosis. No spinal canal or neuroforaminal stenosis.  L5-S1: Unchanged mild disc bulging with superimposed small right extraforaminal disc protrusion contacting the exiting right L5 nerve root. Unchanged severe bilateral facet arthropathy. Unchanged moderate right and mild left neuroforaminal stenosis. No spinal canal stenosis.  IMPRESSION: 1. No significant interval change. Unchanged small right extraforaminal disc protrusion at L5-S1 contacting the exiting right L5 nerve root. Unchanged moderate right and mild left neuroforaminal stenosis at this level. 2. Unchanged small right extraforaminal disc protrusion at L2-L3 encroaching on the exiting  right L2 nerve root. 3. Unchanged mild lateral recess stenosis at L3-L4 and L4-L5.   Electronically Signed By: Titus Dubin M.D. On: 08/01/2020 12:21    Complexity Note: Imaging results reviewed. Results shared with Mr. Jost, using Layman's terms.                         ROS  Cardiovascular: Heart trouble, Daily Aspirin intake, High blood pressure, Chest pain and Heart attack ( Date: 10/95 , 09/08) Pulmonary or Respiratory: Shortness of breath and Temporary stoppage of breathing during sleep Neurological: No reported neurological signs or symptoms such as seizures, abnormal skin sensations, urinary and/or fecal incontinence, being born with an abnormal open spine and/or a tethered spinal cord Psychological-Psychiatric: Depressed Gastrointestinal: Heartburn due to stomach pushing into lungs (Hiatal hernia) and Reflux or heatburn Genitourinary: No reported renal or genitourinary signs or symptoms such as difficulty  voiding or producing urine, peeing blood, non-functioning kidney, kidney stones, difficulty emptying the bladder, difficulty controlling the flow of urine, or chronic kidney disease Hematological: No reported hematological signs or symptoms such as prolonged bleeding, low or poor functioning platelets, bruising or bleeding easily, hereditary bleeding problems, low energy levels due to low hemoglobin or being anemic Endocrine: High blood sugar requiring insulin (IDDM) Rheumatologic: No reported rheumatological signs and symptoms such as fatigue, joint pain, tenderness, swelling, redness, heat, stiffness, decreased range of motion, with or without associated rash Musculoskeletal: Negative for myasthenia gravis, muscular dystrophy, multiple sclerosis or malignant hyperthermia Work History: Retired  Allergies  Mr. Mcleary is allergic to bee venom, ace inhibitors, isosorbide, lamotrigine, metformin and related, bupropion, and crestor [rosuvastatin calcium].  Laboratory Chemistry Profile   Renal No results found for: BUN, CREATININE, LABCREA, BCR, GFR, GFRAA, GFRNONAA, SPECGRAV, PHUR, PROTEINUR   Electrolytes No results found for: NA, K, CL, CALCIUM, MG, PHOS   Hepatic No results found for: AST, ALT, ALBUMIN, ALKPHOS, AMYLASE, LIPASE, AMMONIA   ID No results found for: LYMEIGGIGMAB, HIV, SARSCOV2NAA, STAPHAUREUS, MRSAPCR, HCVAB, PREGTESTUR, RMSFIGG, QFVRPH1IGG, QFVRPH2IGG, LYMEIGGIGMAB   Bone No results found for: VD25OH, [id number], [id number], [id number], 25OHVITD1, 25OHVITD2, 25OHVITD3, TESTOFREE, TESTOSTERONE   Endocrine No results found for: GLUCOSE, GLUCOSEU, HGBA1C, TSH, FREET4, TESTOFREE, TESTOSTERONE, SHBG, ESTRADIOL, ESTRADIOLPCT, ESTRADIOLFRE, LABPREG, ACTH, CRTSLPL, UCORFRPERLTR, UCORFRPERDAY, CORTISOLBASE, LABPREG   Neuropathy No results found for: VITAMINB12, FOLATE, HGBA1C, HIV   CNS No results found for: COLORCSF, APPEARCSF, RBCCOUNTCSF, WBCCSF, POLYSCSF, LYMPHSCSF, EOSCSF,  PROTEINCSF, GLUCCSF, JCVIRUS, CSFOLI, IGGCSF, LABACHR, ACETBL, LABACHR, ACETBL   Inflammation (CRP: Acute  ESR: Chronic) No results found for: CRP, ESRSEDRATE, LATICACIDVEN   Rheumatology No results found for: RF, ANA, LABURIC, URICUR, LYMEIGGIGMAB, LYMEABIGMQN, HLAB27   Coagulation No results found for: INR, LABPROT, APTT, PLT, DDIMER, Jeanella Cara, AT3   Cardiovascular Lab Results  Component Value Date   TROPONINI <0.03 06/02/2018     Screening No results found for: SARSCOV2NAA, COVIDSOURCE, STAPHAUREUS, MRSAPCR, HCVAB, HIV, PREGTESTUR   Cancer No results found for: CEA, CA125, LABCA2   Allergens No results found for: ALMOND, APPLE, ASPARAGUS, AVOCADO, BANANA, BARLEY, BASIL, B[place]LE[place], GREENBEAN, LIMABEAN, WHITEBEAN, BEEFIGE, REDBEET, BLUEBERRY, BROCCOLI, CABBAGE, MELON, CARROT, CASEIN, CASHEWNUT, CAULIFLOWER, CELERY     Note: Lab results reviewed.  [place]  Drug: Mr. Kunzler  reports no history of drug use. Alcohol:  reports current alcohol use. Tobacco:  reports that he quit smoking about 25 years ago. His smoking use included cigarettes. He has a 25.00 pack-year smoking history. He has never used smokeless tobacco. Medical:  has a  past medical history of Allergic state, Anxiety, Asthma, Coronary artery disease, Depression, Diabetes mellitus without complication ([place]), Diverticulosis, GERD (gastroesophageal reflux disease), Hyperlipemia, Hypertension, Midsystolic murmur, Myocardial infarction ([place]) (1995 and 2008), Prostatitis, Proteinuria, and Sleep apnea. Family: family history includes Cancer in his father and mother; Heart disease in his father and mother.  Past Surgical History:  Procedure Laterality Date  . COLON SURGERY  2008   6 inches removed due to diverticulosis  . COLONOSCOPY WITH PROPOFOL N/A 09/13/2016   Procedure: COLONOSCOPY WITH PROPOFOL;  Surgeon: Manya Silvas, MD;  Location: [facility] ENDOSCOPY;  Service: Endoscopy;  Laterality: N/A;  . CORONARY ANGIOPLASTY      6 stents on 2 separate procedures  . CORONARY ARTERY BYPASS GRAFT  09/28/1994   2 vessels  . double bipass    . ESOPHAGOGASTRODUODENOSCOPY  11/19/2013  . sinus sugery    . TENDON RECONSTRUCTION Right 03/26/2018   Procedure: DEBRIDEMENT OF THE COMMON EXTENSOR ORIGIN OF ELBOW;  Surgeon: Corky Mull, MD;  Location: [facility] ORS;  Service: Orthopedics;  Laterality: Right;  . TONSILLECTOMY     Active Ambulatory Problems    Diagnosis Date Noted  . Acute right-sided low back pain with right-sided sciatica 07/27/2020  . CAD (coronary artery disease) 05/20/2012  . CAD (coronary artery disease), autologous vein bypass graft 08/23/2020  . Lateral epicondylitis, left elbow 03/18/2018  . Lumbar radicular pain 07/27/2020  . Lumbar spondylosis 01/21/2020  . Rotator cuff tendinitis, right 01/20/2019  . S/P drug eluting coronary stent placement 07/10/2020  . Strain of right shoulder 01/20/2019  . Type 2 diabetes mellitus with microalbuminuria ([place]) 06/02/2014  . Neuroforaminal stenosis of lumbar spine 08/23/2020   Resolved Ambulatory Problems    Diagnosis Date Noted  . No Resolved Ambulatory Problems   Past Medical History:  Diagnosis Date  . Allergic state   . Anxiety   . Asthma   . Coronary artery disease   . Depression   . Diabetes mellitus without complication ([place])   . Diverticulosis   . GERD (gastroesophageal reflux disease)   . Hyperlipemia   . Hypertension   . Midsystolic murmur   . Myocardial infarction [facility]) 1995 and 2008  . Prostatitis   . Proteinuria   . Sleep apnea    Constitutional Exam  General appearance: Well nourished, well developed, and well hydrated. In no apparent acute distress Vitals:   08/23/20 1442  BP: 118/68  Pulse: 94  Resp: 16  Temp: 97.9 F (36.6 C)  TempSrc: Temporal  SpO2: 98%  Weight: 192 lb (87.1 kg)  Height: '5\' 9"'  (1.753 m)   BMI Assessment: Estimated body mass index is 28.35 kg/m as calculated from the following:   Height as of this  encounter: '5\' 9"'  (1.753 m).   Weight as of this encounter: 192 lb (87.1 kg).  BMI interpretation table: BMI level Category Range association with higher incidence of chronic pain  <18 kg/m2 Underweight   18.5-24.9 kg/m2 Ideal body weight   25-29.9 kg/m2 Overweight Increased incidence by 20%  30-34.9 kg/m2 Obese (Class I) Increased incidence by 68%  35-39.9 kg/m2 Severe obesity (Class II) Increased incidence by 136%  >40 kg/m2 Extreme obesity (Class III) Increased incidence by 254%   Patient's current BMI Ideal Body weight  Body mass index is 28.35 kg/m. Ideal body weight: 70.7 kg (155 lb 13.8 oz) Adjusted ideal body weight: 77.3 kg (170 lb 5.1 oz)   BMI Readings from Last 4 Encounters:  08/23/20 28.35 kg/m  07/27/20 29.53 kg/m  03/26/18 29.53 kg/m  03/23/18 29.53 kg/m   Wt Readings from Last 4 Encounters:  08/23/20 192 lb (87.1 kg)  07/27/20 200 lb (90.7 kg)  03/26/18 200 lb (90.7 kg)  03/23/18 200 lb (90.7 kg)    Psych/Mental status: Alert, oriented x 3 (person, place, & time)       Eyes: PERLA Respiratory: No evidence of acute respiratory distress  Cervical Spine Exam  Skin & Axial Inspection: No masses, redness, edema, swelling, or associated skin lesions Alignment: Symmetrical Functional ROM: Unrestricted ROM      Stability: No instability detected Muscle Tone/Strength: Functionally intact. No obvious neuro-muscular anomalies detected. Sensory (Neurological): Unimpaired   Upper Extremity (UE) Exam    Side: Right upper extremity  Side: Left upper extremity  Skin & Extremity Inspection: Skin color, temperature, and hair growth are WNL. No peripheral edema or cyanosis. No masses, redness, swelling, asymmetry, or associated skin lesions. No contractures.  Skin & Extremity Inspection: Skin color, temperature, and hair growth are WNL. No peripheral edema or cyanosis. No masses, redness, swelling, asymmetry, or associated skin lesions. No contractures.  Functional ROM:  Unrestricted ROM          Functional ROM: Unrestricted ROM          Muscle Tone/Strength: Functionally intact. No obvious neuro-muscular anomalies detected.   Muscle Tone/Strength: Functionally intact. No obvious neuro-muscular anomalies detected.  Sensory (Neurological): Unimpaired          Sensory (Neurological): Unimpaired          Palpation: No palpable anomalies              Palpation: No palpable anomalies              Provocative Test(s):  Phalen's test: deferred Tinel's test: deferred Apley's scratch test (touch opposite shoulder):  Action 1 (Across chest): deferred Action 2 (Overhead): deferred Action 3 (LB reach): deferred   Provocative Test(s):  Phalen's test: deferred Tinel's test: deferred Apley's scratch test (touch opposite shoulder):  Action 1 (Across chest): deferred Action 2 (Overhead): deferred Action 3 (LB reach): deferred     Lumbar Exam  Skin & Axial Inspection: No masses, redness, or swelling Alignment: Symmetrical Functional ROM: Pain restricted ROM affecting both sides right greater than left Stability: No instability detected Muscle Tone/Strength: Functionally intact. No obvious neuro-muscular anomalies detected. Sensory (Neurological): Dermatomal pain pattern Palpation: No palpable anomalies       Provocative Tests: Hyperextension/rotation test: (+) due to pain. Lumbar quadrant test (Kemp's test): (+) on the right for foraminal stenosis Lateral bending test: (+) ipsilateral radicular pain, bilaterally. Positive for bilateral foraminal stenosis. Patrick's Maneuver: deferred today                   FABER* test: deferred today                   S-I anterior distraction/compression test: deferred today         S-I lateral compression test: deferred today         S-I Thigh-thrust test: deferred today         S-I Gaenslen's test: deferred today         *(Flexion, ABduction and External Rotation)  Gait & Posture Assessment  Ambulation: Patient ambulates  using a cane Gait: Age-related, senile gait pattern Posture: Difficulty standing up straight, due to pain   Lower Extremity Exam    Side: Right lower extremity  Side: Left lower extremity  Stability: No instability observed  Stability: No instability observed          Skin & Extremity Inspection: Skin color, temperature, and hair growth are WNL. No peripheral edema or cyanosis. No masses, redness, swelling, asymmetry, or associated skin lesions. No contractures.  Skin & Extremity Inspection: Skin color, temperature, and hair growth are WNL. No peripheral edema or cyanosis. No masses, redness, swelling, asymmetry, or associated skin lesions. No contractures.  Functional ROM: Pain restricted ROM for hip and knee joints Limited SLR (straight leg raise)  Functional ROM: Unrestricted ROM                  Muscle Tone/Strength: Functionally intact. No obvious neuro-muscular anomalies detected.  Muscle Tone/Strength: Functionally intact. No obvious neuro-muscular anomalies detected.  Sensory (Neurological): Dermatomal pain pattern top of foot (L5)  Sensory (Neurological): Unimpaired        DTR: Patellar: 1+: trace Achilles: deferred today Plantar: deferred today  DTR: Patellar: 1+: trace Achilles: deferred today Plantar: deferred today  Palpation: No palpable anomalies  Palpation: No palpable anomalies   Assessment  Primary Diagnosis & Pertinent Problem List: The primary encounter diagnosis was Lumbar radiculopathy. Diagnoses of Lumbar radicular pain (right L5/S1), Coronary artery disease involving native coronary artery of native heart with angina pectoris ([place]), Neuroforaminal stenosis of lumbar spine, S/P drug eluting coronary stent placement, and Chronic pain syndrome were also pertinent to this visit.  Visit Diagnosis (New problems to examiner): 1. Lumbar radiculopathy   2. Lumbar radicular pain (right L5/S1)   3. Coronary artery disease involving native coronary artery of native  heart with angina pectoris ([place])   4. Neuroforaminal stenosis of lumbar spine   5. S/P drug eluting coronary stent placement   6. Chronic pain syndrome    Plan of Care (Initial workup plan)  Note: Mr. Volkert was reminded that as per protocol, today's visit has been an evaluation only. We have not taken over the patient's controlled substance management.   Lab Orders     Compliance Drug Analysis, Ur  Referral Orders     Ambulatory referral to Physical Therapy Pharmacotherapy (current): Medications ordered:  Meds ordered this encounter  Medications  . gabapentin (NEURONTIN) 300 MG capsule    Sig: Take 2 capsules (600 mg total) by mouth 3 (three) times daily.    Dispense:  180 capsule    Refill:  1   Medications administered during this visit: Waldo A. Faircloth had no medications administered during this visit.   Pharmacological management options:  Opioid Analgesics: The patient was informed that there is no guarantee that he would be a candidate for opioid analgesics. The decision will be made following CDC guidelines. This decision will be based on the results of diagnostic studies, as well as Mr. Bureau's risk profile.   Membrane stabilizer: Increase gabapentin to 600 mg 3 times daily as above continue Cymbalta as prescribed.  Muscle relaxant: Tried and failed Flexeril.  Can consider tizanidine or Robaxin in future  NSAID: Medically contraindicated already on Plavix and aspirin.  Other analgesic(s): To be determined at a later time   Interventional management options: Mr. Nazir was informed that there is no guarantee that he would be a candidate for interventional therapies. The decision will be based on the results of diagnostic studies, as well as Mr. Hoefer's risk profile.  Procedure(s) under consideration:  avoid interventional procedures at this time, patient had a drug-eluting stent placed in August 2021.  Will need to be on dual antiplatelet therapy for at least 12 months.  Provider-requested follow-up: Return in about 6 weeks (around 10/04/2020) for Medication Management, in person.  Future Appointments  Date Time Provider [place]  10/03/2020 11:20 AM Gillis Santa, MD [facility] None    Note by: Gillis Santa, MD Date: 08/23/2020; Time: 8:08 AM

## 2020-08-24 ENCOUNTER — Telehealth: Payer: Self-pay | Admitting: Student in an Organized Health Care Education/Training Program

## 2020-08-24 DIAGNOSIS — M5416 Radiculopathy, lumbar region: Secondary | ICD-10-CM

## 2020-08-24 NOTE — Telephone Encounter (Signed)
Will you please order? 

## 2020-08-24 NOTE — Telephone Encounter (Signed)
Patient called today asking about referral to [facility]. He needs order for PT sent over to [facility] at [facility] therapy. The one in the chart is set for [facility]. Please ask Dr. Cherylann Ratel to make referral to [facility] in [place]. It needs to be set at outpatient and printed then we can fax. Patient has appt. For next week. Thank you

## 2020-08-24 NOTE — Telephone Encounter (Signed)
Please arrange this

## 2020-08-25 LAB — COMPLIANCE DRUG ANALYSIS, UR

## 2020-10-03 ENCOUNTER — Ambulatory Visit
Payer: Medicare Other | Attending: Student in an Organized Health Care Education/Training Program | Admitting: Student in an Organized Health Care Education/Training Program

## 2020-10-03 ENCOUNTER — Other Ambulatory Visit: Payer: Self-pay

## 2020-10-03 ENCOUNTER — Encounter: Payer: Self-pay | Admitting: Student in an Organized Health Care Education/Training Program

## 2020-10-03 VITALS — BP 108/64 | HR 67 | Temp 97.3°F | Resp 18 | Ht 69.0 in | Wt 193.0 lb

## 2020-10-03 DIAGNOSIS — I25119 Atherosclerotic heart disease of native coronary artery with unspecified angina pectoris: Secondary | ICD-10-CM

## 2020-10-03 DIAGNOSIS — Z955 Presence of coronary angioplasty implant and graft: Secondary | ICD-10-CM

## 2020-10-03 DIAGNOSIS — G894 Chronic pain syndrome: Secondary | ICD-10-CM

## 2020-10-03 DIAGNOSIS — M48061 Spinal stenosis, lumbar region without neurogenic claudication: Secondary | ICD-10-CM

## 2020-10-03 DIAGNOSIS — M5416 Radiculopathy, lumbar region: Secondary | ICD-10-CM | POA: Diagnosis not present

## 2020-10-03 MED ORDER — GABAPENTIN 300 MG PO CAPS: 600.0000 mg | ORAL_CAPSULE | Freq: Three times a day (TID) | ORAL | 2 refills | Status: DC

## 2020-10-03 NOTE — Progress Notes (Signed)
Safety precautions to be maintained throughout the outpatient stay will include: orient to surroundings, keep bed in low position, maintain call bell within reach at all times, provide assistance with transfer out of bed and ambulation.  

## 2020-10-03 NOTE — Progress Notes (Signed)
PROVIDER NOTE: Information contained herein reflects review and annotations entered in association with encounter. Interpretation of such information and data should be left to medically-trained personnel. Information provided to patient can be located elsewhere in the medical record under "Patient Instructions". Document created using STT-dictation technology, any transcriptional errors that may result from process are unintentional.    Patient: Nicholas Knapp  Service Category: E/M  Provider: Bilal Lateef, MD  DOB: 07/01/1956  DOS: 10/03/2020  Specialty: Interventional Pain Management  MRN: [id number]  Setting: Ambulatory outpatient  PCP: Knapp, Nicholas J III, MD  Type: Established Patient    Referring Provider: Klein, Nicholas J III, MD  Location: Office  Delivery: Face-to-face     HPI  [name], a 64 y.o. year old male, is here today because of his Lumbar radiculopathy [M54.16]. Nicholas Knapp's primary complain today is Hip Pain (right) Last encounter: My last encounter with him was on 08/24/2020. Pertinent problems: Nicholas Knapp has Acute right-sided low back pain with right-sided sciatica; CAD (coronary artery disease); CAD (coronary artery disease), autologous vein bypass graft; Lateral epicondylitis, left elbow; Lumbar radicular pain; Lumbar spondylosis; Rotator cuff tendinitis, right; Strain of right shoulder; and Type 2 diabetes mellitus with microalbuminuria (HCC) on their pertinent problem list. Pain Assessment: Severity of Chronic pain is reported as a  /10. Location: Hip Right/right leg. Onset: More than a month ago. Quality: Dull. Timing: Intermittent. Modifying factor(s): standing. Vitals:  height is 5' 9" (1.753 m) and weight is 193 lb (87.5 kg). His temporal temperature is 97.3 F (36.3 C) (abnormal). His blood pressure is 108/64 and his pulse is 67. His respiration is 18 and oxygen saturation is 100%.   Reason for encounter: medication management.   Finding benefit with increased  dose of gabapentin, taking 300 mg in the morning, 600 mg in the afternoon, 600 mg at night which is providing analgesic and functional benefit. Is continuing with PT  From HPI, initial patient visit 08/23/2020 Nicholas Knapp is a 64-year-old male who presents with a chief complaint of of lower back, right buttock and right lower extremity pain. He reports that the pain is traveling down from his right buttock down his posterior leg all the way down to his right foot. He reports that he has had multiple instances of this pain, for many years, however his pain flare recently has been the worst.  Patient saw Dr[facility] Morales [facility] and underwent an L5-S1 right-sided transforaminal epidural steroid injection, however Dr. Morales was concerned with the amount of pain that he was experiencing and together with his wife and himself, they made the decision to be sent to the emergency department for pain management. He reports that he received multiple medications, and initially the pain was somewhat tolerable, but it has gotten a lot worse recently.  Of note, patient had a coronary stent placed for coronary artery disease at the beginning of August and per his cardiologist, Dr. Wangneeds to be on dual antiplatelet therapy for at least 3 months but preferably 12 months.  Patient does have a significant cardiac history with over 5 stents in place.  He is high risk to be off of his anticoagulation.  In regards to medication management, patient is on Cymbalta 60 mg.  He is on gabapentin 300 mg 3 times daily.  He is not noticing any side effects with this medication such as sedation or lower extremity swelling.  He was prescribed hydrocodone which he takes intermittently when he   has severe pain flare.  Patient is requesting a referral to physical therapy and met been with [facility] for his right lower extremity radicular pain.  ROS  Constitutional: Denies any fever or  chills Gastrointestinal: No reported hemesis, hematochezia, vomiting, or acute GI distress Musculoskeletal: Denies any acute onset joint swelling, redness, loss of ROM, or weakness Neurological: No reported episodes of acute onset apraxia, aphasia, dysarthria, agnosia, amnesia, paralysis, loss of coordination, or loss of consciousness  Medication Review  DULoxetine, EPINEPHrine, aspirin, clopidogrel, diphenhydrAMINE, gabapentin, glimepiride, glucose blood, losartan, metoprolol succinate, nitroGLYCERIN, pantoprazole, polycarbophil, rosuvastatin, and traZODone  History Review  Allergy: Nicholas Knapp is allergic to bee venom, ace inhibitors, isosorbide, lamotrigine, metformin and related, bupropion, and crestor [rosuvastatin calcium]. Drug: Nicholas Knapp  reports no history of drug use. Alcohol:  reports current alcohol use. Tobacco:  reports that he quit smoking about 26 years ago. His smoking use included cigarettes. He has a 25.00 pack-year smoking history. He has never used smokeless tobacco. Social: Nicholas Knapp  reports that he quit smoking about 26 years ago. His smoking use included cigarettes. He has a 25.00 pack-year smoking history. He has never used smokeless tobacco. He reports current alcohol use. He reports that he does not use drugs. Medical:  has a past medical history of Allergic state, Anxiety, Asthma, Coronary artery disease, Depression, Diabetes mellitus without complication (HCC), Diverticulosis, GERD (gastroesophageal reflux disease), Hyperlipemia, Hypertension, Midsystolic murmur, Myocardial infarction (HCC) (1995 and 2008), Prostatitis, Proteinuria, and Sleep apnea. Surgical: Nicholas Knapp  has a past surgical history that includes double bipass; Tonsillectomy; sinus sugery; Colonoscopy with propofol (N/A, 09/13/2016); Colon surgery (2008); Coronary angioplasty; Coronary artery bypass graft (09/28/1994); Esophagogastroduodenoscopy (11/19/2013); and Tendon reconstruction (Right,  03/26/2018). Family: family history includes Cancer in his father and mother; Heart disease in his father and mother.  Laboratory Chemistry Profile   Renal No results found for: BUN, CREATININE, LABCREA, BCR, GFR, GFRAA, GFRNONAA, LABVMA, EPIRU, EPINEPH24HUR, NOREPRU, NOREPI24HUR, DOPARU, DOPAM24HRUR   Hepatic No results found for: AST, ALT, ALBUMIN, ALKPHOS, HCVAB, AMYLASE, LIPASE, AMMONIA   Electrolytes No results found for: NA, K, CL, CALCIUM, MG, PHOS   Bone No results found for: VD25OH, VD125OH2TOT, VD3125OH2, VD2125OH2, 25OHVITD1, 25OHVITD2, 25OHVITD3, TESTOFREE, TESTOSTERONE   Inflammation (CRP: Acute Phase) (ESR: Chronic Phase) No results found for: CRP, ESRSEDRATE, LATICACIDVEN     Note: Above Lab results reviewed.  Recent Imaging Review  MR LUMBAR SPINE WO CONTRAST CLINICAL DATA:  Severe right buttock and leg pain. Unable to sit. Recent epidural injection.  EXAM: MRI LUMBAR SPINE WITHOUT CONTRAST  TECHNIQUE: Multiplanar, multisequence MR imaging of the lumbar spine was performed. No intravenous contrast was administered.  COMPARISON:  MRI lumbar spine dated July 18, 2020.  FINDINGS: Segmentation:  Standard.  Alignment:  Physiologic.  Vertebrae: No fracture, evidence of discitis, or suspicious bone lesion. Scattered vertebral body hemangiomas again noted.  Conus medullaris and cauda equina: Conus extends to the L2 level. Conus and cauda equina appear normal.  Paraspinal and other soft tissues: Negative.  Disc levels:  T12-L1:  Negative.  L1-L2:  Negative.  L2-L3: Unchanged small right extraforaminal/far lateral disc protrusion encroaching on the exiting right L2 nerve root. Unchanged mild right facet arthropathy. No stenosis.  L3-L4: Unchanged mild disc bulging and bilateral facet arthropathy. Unchanged mild right lateral recess stenosis. No spinal canal or neuroforaminal stenosis.  L4-L5: Unchanged mild disc bulging and moderate bilateral  facet arthropathy. Unchanged mild bilateral lateral recess stenosis. No spinal canal or neuroforaminal stenosis.  L5-S1: Unchanged mild   disc bulging with superimposed small right extraforaminal disc protrusion contacting the exiting right L5 nerve root. Unchanged severe bilateral facet arthropathy. Unchanged moderate right and mild left neuroforaminal stenosis. No spinal canal stenosis.  IMPRESSION: 1. No significant interval change. Unchanged small right extraforaminal disc protrusion at L5-S1 contacting the exiting right L5 nerve root. Unchanged moderate right and mild left neuroforaminal stenosis at this level. 2. Unchanged small right extraforaminal disc protrusion at L2-L3 encroaching on the exiting right L2 nerve root. 3. Unchanged mild lateral recess stenosis at L3-L4 and L4-L5.  Electronically Signed   By: William T Derry M.D.   On: 08/01/2020 12:21 Note: Reviewed        Physical Exam  General appearance: Well nourished, well developed, and well hydrated. In no apparent acute distress Mental status: Alert, oriented x 3 (person, place, & time)       Respiratory: No evidence of acute respiratory distress Eyes: PERLA Vitals: BP 108/64   Pulse 67   Temp (!) 97.3 F (36.3 C) (Temporal)   Resp 18   Ht 5' 9" (1.753 m)   Wt 193 lb (87.5 kg)   SpO2 100%   BMI 28.50 kg/m  BMI: Estimated body mass index is 28.5 kg/m as calculated from the following:   Height as of this encounter: 5' 9" (1.753 m).   Weight as of this encounter: 193 lb (87.5 kg). Ideal: Ideal body weight: 70.7 kg (155 lb 13.8 oz) Adjusted ideal body weight: 77.4 kg (170 lb 11.5 oz)   Lumbar Exam  Skin & Axial Inspection: No masses, redness, or swelling Alignment: Symmetrical Functional ROM: Pain restricted ROM affecting both sides right greater than left Stability: No instability detected Muscle Tone/Strength: Functionally intact. No obvious neuro-muscular anomalies detected. Sensory (Neurological):  Dermatomal pain pattern Palpation: No palpable anomalies       Provocative Tests: Hyperextension/rotation test: (+) due to pain. Lumbar quadrant test (Kemp's test): (+) on the right for foraminal stenosis Lateral bending test: (+) ipsilateral radicular pain, bilaterally. Positive for bilateral foraminal stenosis. Patrick's Maneuver: deferred today                   FABER* test: deferred today                   S-I anterior distraction/compression test: deferred today         S-I lateral compression test: deferred today         S-I Thigh-thrust test: deferred today         S-I Gaenslen's test: deferred today         *(Flexion, ABduction and External Rotation)  Gait & Posture Assessment  Ambulation: Patient ambulates using a cane Gait: Age-related, senile gait pattern Posture: Difficulty standing up straight, due to pain   Lower Extremity Exam    Side: Right lower extremity  Side: Left lower extremity  Stability: No instability observed          Stability: No instability observed          Skin & Extremity Inspection: Skin color, temperature, and hair growth are WNL. No peripheral edema or cyanosis. No masses, redness, swelling, asymmetry, or associated skin lesions. No contractures.  Skin & Extremity Inspection: Skin color, temperature, and hair growth are WNL. No peripheral edema or cyanosis. No masses, redness, swelling, asymmetry, or associated skin lesions. No contractures.  Functional ROM: Pain restricted ROM for hip and knee joints Limited SLR (straight leg raise)  Functional ROM: Unrestricted ROM                    Muscle Tone/Strength: Functionally intact. No obvious neuro-muscular anomalies detected.  Muscle Tone/Strength: Functionally intact. No obvious neuro-muscular anomalies detected.  Sensory (Neurological): Dermatomal pain pattern top of foot (L5)  Sensory (Neurological): Unimpaired        DTR: Patellar: 1+: trace Achilles: deferred today Plantar: deferred today   DTR: Patellar: 1+: trace Achilles: deferred today Plantar: deferred today  Palpation: No palpable anomalies  Palpation: No palpable anomalies     Assessment   Status Diagnosis  Controlled Controlled Controlled 1. Lumbar radiculopathy   2. Lumbar radicular pain (right L5/S1)   3. Coronary artery disease involving native coronary artery of native heart with angina pectoris (HCC)   4. Neuroforaminal stenosis of lumbar spine   5. S/P drug eluting coronary stent placement   6. Chronic pain syndrome       Plan of Care   Nicholas Knapp has a current medication list which includes the following long-term medication(s): diphenhydramine, duloxetine, glimepiride, losartan, metoprolol succinate, nitroglycerin, pantoprazole, rosuvastatin, trazodone, and gabapentin.  Continue with gabapentin for the time being. Is finding benefit with current dose. Will defer any interventional treatments until after August 2022 as the patient had a coronary stent placed August 2021. Will require 12 months of dual antiplatelet therapy. Can consider epidural steroid injection, spinal cord stimulator trial after he transitions from dual antiplatelet therapy to single antiplatelet therapy.  Pharmacotherapy (Medications Ordered): Meds ordered this encounter  Medications  . gabapentin (NEURONTIN) 300 MG capsule    Sig: Take 2 capsules (600 mg total) by mouth 3 (three) times daily.    Dispense:  180 capsule    Refill:  2   Follow-up plan:   Return in about 4 months (around 02/03/2021) for Medication Management, in person.   Recent Visits Date Type Provider Dept  08/23/20 Office Visit Knapp, Bilal, MD [facility]  Showing recent visits within past 90 days and meeting all other requirements Today's Visits Date Type Provider Dept  10/03/20 Office Visit Knapp, Bilal, MD [facility]  Showing today's visits and meeting all other requirements Future Appointments No visits were found  meeting these conditions. Showing future appointments within next 90 days and meeting all other requirements  I discussed the assessment and treatment plan with the patient. The patient was provided an opportunity to ask questions and all were answered. The patient agreed with the plan and demonstrated an understanding of the instructions.  Patient advised to call back or seek an in-person evaluation if the symptoms or condition worsens.  Duration of encounter:20minutes.  Note by: Nicholas Lateef, MD Date: 10/03/2020; Time: 1:05 PM 

## 2021-01-30 ENCOUNTER — Other Ambulatory Visit: Payer: Self-pay

## 2021-01-30 ENCOUNTER — Encounter: Payer: Self-pay | Admitting: Student in an Organized Health Care Education/Training Program

## 2021-01-30 ENCOUNTER — Ambulatory Visit
Payer: Medicare Other | Attending: Student in an Organized Health Care Education/Training Program | Admitting: Student in an Organized Health Care Education/Training Program

## 2021-01-30 VITALS — BP 121/90 | HR 59 | Temp 97.2°F | Resp 16 | Ht 69.0 in | Wt 197.0 lb

## 2021-01-30 DIAGNOSIS — I25119 Atherosclerotic heart disease of native coronary artery with unspecified angina pectoris: Secondary | ICD-10-CM | POA: Diagnosis present

## 2021-01-30 DIAGNOSIS — G894 Chronic pain syndrome: Secondary | ICD-10-CM | POA: Diagnosis present

## 2021-01-30 DIAGNOSIS — M5416 Radiculopathy, lumbar region: Secondary | ICD-10-CM | POA: Diagnosis present

## 2021-01-30 DIAGNOSIS — M48061 Spinal stenosis, lumbar region without neurogenic claudication: Secondary | ICD-10-CM | POA: Diagnosis present

## 2021-01-30 MED ORDER — GABAPENTIN 300 MG PO CAPS: 600.0000 mg | ORAL_CAPSULE | Freq: Three times a day (TID) | ORAL | 5 refills | Status: DC

## 2021-01-30 NOTE — Progress Notes (Signed)
PROVIDER NOTE: Information contained herein reflects review and annotations entered in association with encounter. Interpretation of such information and data should be left to medically-trained personnel. Information provided to patient can be located elsewhere in the medical record under "Patient Instructions". Document created using STT-dictation technology, any transcriptional errors that may result from process are unintentional.    Patient: Nicholas Knapp  Service Category: E/M  Provider: Edward Jolly, MD  DOB: 22-Mar-1956  DOS: 01/30/2021  Specialty: Interventional Pain Management  MRN: [id number]  Setting: Ambulatory outpatient  PCP: Lynnea Ferrier, MD  Type: Established Patient    Referring Provider: Lynnea Ferrier, MD  Location: Office  Delivery: Face-to-face     HPI  [name], a 65 y.o. year old male, is here today because of his Coronary artery disease involving native coronary artery of native heart with angina pectoris (HCC) [I25.119]. [name] primary complain today is Hip Pain (right) Last encounter: My last encounter with him was on 10/03/2020. Pertinent problems: [name] has Acute right-sided low back pain with right-sided sciatica; CAD (coronary artery disease); CAD (coronary artery disease), autologous vein bypass graft; Lateral epicondylitis, left elbow; Lumbar radicular pain; Lumbar spondylosis; Rotator cuff tendinitis, right; Strain of right shoulder; and Type 2 diabetes mellitus with microalbuminuria (HCC) on their pertinent problem list. Pain Assessment: Severity of Chronic pain is reported as a 2 /10. Location: Hip Right/denies. Onset: More than a month ago. Quality: Stabbing. Timing: Intermittent. Modifying factor(s): medication. Vitals:  height is 5\' 9"  (1.753 m) and weight is 197 lb (89.4 kg). His temperature is 97.2 F (36.2 C) (abnormal). His blood pressure is 121/90 and his pulse is 59 (abnormal). His respiration is 16 and oxygen saturation is 99%.    Reason for encounter: medication management.    No change in medical history since last visit.  Patient's pain is at baseline.  Patient continues multimodal pain regimen as prescribed.  States that it provides pain relief and improvement in functional status. Finds benefit with gabapentin, recommend patient increase to 600 mg three times a day   ROS  Constitutional: Denies any fever or chills Gastrointestinal: No reported hemesis, hematochezia, vomiting, or acute GI distress Musculoskeletal: Right hip pain Neurological: No reported episodes of acute onset apraxia, aphasia, dysarthria, agnosia, amnesia, paralysis, loss of coordination, or loss of consciousness  Medication Review  DULoxetine, EPINEPHrine, aspirin, clopidogrel, diphenhydrAMINE, gabapentin, glimepiride, glucose blood, losartan, metoprolol succinate, nitroGLYCERIN, pantoprazole, polycarbophil, rosuvastatin, and traZODone  History Review  Allergy: [name] is allergic to bee venom, ace inhibitors, isosorbide, lamotrigine, metformin and related, bupropion, and crestor [rosuvastatin calcium]. Drug: [name]  reports no history of drug use. Alcohol:  reports current alcohol use. Tobacco:  reports that he quit smoking about 26 years ago. His smoking use included cigarettes. He has a 25.00 pack-year smoking history. He has never used smokeless tobacco. Social: [name]  reports that he quit smoking about 26 years ago. His smoking use included cigarettes. He has a 25.00 pack-year smoking history. He has never used smokeless tobacco. He reports current alcohol use. He reports that he does not use drugs. Medical:  has a past medical history of Allergic state, Anxiety, Asthma, Coronary artery disease, Depression, Diabetes mellitus without complication (HCC), Diverticulosis, GERD (gastroesophageal reflux disease), Hyperlipemia, Hypertension, Midsystolic murmur, Myocardial infarction (HCC) (1995 and 2008), Prostatitis, Proteinuria, and  Sleep apnea. Surgical: [name]  has a past surgical history that includes double bipass; Tonsillectomy; sinus sugery; Colonoscopy with propofol (N/A, 09/13/2016);  Colon surgery (2008); Coronary angioplasty; Coronary artery bypass graft (09/28/1994); Esophagogastroduodenoscopy (11/19/2013); and Tendon reconstruction (Right, 03/26/2018). Family: family history includes Cancer in his father and mother; Heart disease in his father and mother.    Recent Imaging Review  MR LUMBAR SPINE WO CONTRAST CLINICAL DATA:  Severe right buttock and leg pain. Unable to sit. Recent epidural injection.  EXAM: MRI LUMBAR SPINE WITHOUT CONTRAST  TECHNIQUE: Multiplanar, multisequence MR imaging of the lumbar spine was performed. No intravenous contrast was administered.  COMPARISON:  MRI lumbar spine dated July 18, 2020.  FINDINGS: Segmentation:  Standard.  Alignment:  Physiologic.  Vertebrae: No fracture, evidence of discitis, or suspicious bone lesion. Scattered vertebral body hemangiomas again noted.  Conus medullaris and cauda equina: Conus extends to the L2 level. Conus and cauda equina appear normal.  Paraspinal and other soft tissues: Negative.  Disc levels:  T12-L1:  Negative.  L1-L2:  Negative.  L2-L3: Unchanged small right extraforaminal/far lateral disc protrusion encroaching on the exiting right L2 nerve root. Unchanged mild right facet arthropathy. No stenosis.  L3-L4: Unchanged mild disc bulging and bilateral facet arthropathy. Unchanged mild right lateral recess stenosis. No spinal canal or neuroforaminal stenosis.  L4-L5: Unchanged mild disc bulging and moderate bilateral facet arthropathy. Unchanged mild bilateral lateral recess stenosis. No spinal canal or neuroforaminal stenosis.  L5-S1: Unchanged mild disc bulging with superimposed small right extraforaminal disc protrusion contacting the exiting right L5 nerve root. Unchanged severe bilateral facet arthropathy.  Unchanged moderate right and mild left neuroforaminal stenosis. No spinal canal stenosis.  IMPRESSION: 1. No significant interval change. Unchanged small right extraforaminal disc protrusion at L5-S1 contacting the exiting right L5 nerve root. Unchanged moderate right and mild left neuroforaminal stenosis at this level. 2. Unchanged small right extraforaminal disc protrusion at L2-L3 encroaching on the exiting right L2 nerve root. 3. Unchanged mild lateral recess stenosis at L3-L4 and L4-L5.  Electronically Signed   By: Obie Dredge M.D.   On: 08/01/2020 12:21 Note: Reviewed        Physical Exam  General appearance: Well nourished, well developed, and well hydrated. In no apparent acute distress Mental status: Alert, oriented x 3 (person, place, & time)       Respiratory: No evidence of acute respiratory distress Eyes: PERLA Vitals: BP 121/90   Pulse (!) 59   Temp (!) 97.2 F (36.2 C)   Resp 16   Ht 5\' 9"  (1.753 m)   Wt 197 lb (89.4 kg)   SpO2 99%   BMI 29.09 kg/m  BMI: Estimated body mass index is 29.09 kg/m as calculated from the following:   Height as of this encounter: 5\' 9"  (1.753 m).   Weight as of this encounter: 197 lb (89.4 kg). Ideal: Ideal body weight: 70.7 kg (155 lb 13.8 oz) Adjusted ideal body weight: 78.2 kg (172 lb 5.1 oz)  Lumbar Spine Area Exam  Skin & Axial Inspection: No masses, redness, or swelling Alignment: Symmetrical Functional ROM: Pain restricted ROM       Stability: No instability detected Muscle Tone/Strength: Functionally intact. No obvious neuro-muscular anomalies detected. Sensory (Neurological): Musculoskeletal pain pattern  Lower Extremity Exam    Side: Right lower extremity  Side: Left lower extremity  Stability: No instability observed          Stability: No instability observed          Skin & Extremity Inspection: Skin color, temperature, and hair growth are WNL. No peripheral edema or cyanosis. No masses, redness, swelling,  asymmetry, or associated skin lesions. No contractures.  Skin & Extremity Inspection: Skin color, temperature, and hair growth are WNL. No peripheral edema or cyanosis. No masses, redness, swelling, asymmetry, or associated skin lesions. No contractures.  Functional ROM: Pain restricted ROM for hip and knee joints          Functional ROM: Unrestricted ROM                  Muscle Tone/Strength: Functionally intact. No obvious neuro-muscular anomalies detected.  Muscle Tone/Strength: Functionally intact. No obvious neuro-muscular anomalies detected.  Sensory (Neurological): Musculoskeletal pain pattern        Sensory (Neurological): Unimpaired        DTR: Patellar: deferred today Achilles: deferred today Plantar: deferred today  DTR: Patellar: deferred today Achilles: deferred today Plantar: deferred today  Palpation: No palpable anomalies  Palpation: No palpable anomalies    Assessment   Status Diagnosis  Controlled Controlled Controlled 1. Coronary artery disease involving native coronary artery of native heart with angina pectoris (HCC)   2. Lumbar radiculopathy   3. Lumbar radicular pain (right L5/S1)   4. Neuroforaminal stenosis of lumbar spine   5. Chronic pain syndrome        Plan of Care   [name] has a current medication list which includes the following long-term medication(s): diphenhydramine, duloxetine, glimepiride, losartan, metoprolol succinate, nitroglycerin, pantoprazole, rosuvastatin, trazodone, and gabapentin.  Pharmacotherapy (Medications Ordered): Meds ordered this encounter  Medications  . gabapentin (NEURONTIN) 300 MG capsule    Sig: Take 2 capsules (600 mg total) by mouth 3 (three) times daily.    Dispense:  180 capsule    Refill:  5   Follow-up plan:   Return in about 6 months (around 07/30/2021) for Medication Management, in person.    Recent Visits No visits were found meeting these conditions. Showing recent visits within past 90 days  and meeting all other requirements Today's Visits Date Type Provider Dept  01/30/21 Office Visit Edward Jolly, MD [facility]  Showing today's visits and meeting all other requirements Future Appointments No visits were found meeting these conditions. Showing future appointments within next 90 days and meeting all other requirements  I discussed the assessment and treatment plan with the patient. The patient was provided an opportunity to ask questions and all were answered. The patient agreed with the plan and demonstrated an understanding of the instructions.  Patient advised to call back or seek an in-person evaluation if the symptoms or condition worsens.  Duration of encounter: 15 minutes.  Note by: Edward Jolly, MD Date: 01/30/2021; Time: 11:59 AM

## 2021-01-30 NOTE — Progress Notes (Signed)
Safety precautions to be maintained throughout the outpatient stay will include: orient to surroundings, keep bed in low position, maintain call bell within reach at all times, provide assistance with transfer out of bed and ambulation.  

## 2021-07-19 ENCOUNTER — Encounter: Payer: Medicare Other | Admitting: Student in an Organized Health Care Education/Training Program

## 2021-07-24 ENCOUNTER — Other Ambulatory Visit: Payer: Self-pay

## 2021-07-24 ENCOUNTER — Encounter: Payer: Self-pay | Admitting: Student in an Organized Health Care Education/Training Program

## 2021-07-24 ENCOUNTER — Ambulatory Visit
Payer: Medicare Other | Attending: Student in an Organized Health Care Education/Training Program | Admitting: Student in an Organized Health Care Education/Training Program

## 2021-07-24 VITALS — BP 113/65 | HR 65 | Temp 96.9°F | Resp 15 | Ht 69.0 in | Wt 197.0 lb

## 2021-07-24 DIAGNOSIS — I25119 Atherosclerotic heart disease of native coronary artery with unspecified angina pectoris: Secondary | ICD-10-CM | POA: Insufficient documentation

## 2021-07-24 DIAGNOSIS — M5416 Radiculopathy, lumbar region: Secondary | ICD-10-CM | POA: Diagnosis present

## 2021-07-24 DIAGNOSIS — M47816 Spondylosis without myelopathy or radiculopathy, lumbar region: Secondary | ICD-10-CM | POA: Diagnosis present

## 2021-07-24 DIAGNOSIS — M48061 Spinal stenosis, lumbar region without neurogenic claudication: Secondary | ICD-10-CM | POA: Insufficient documentation

## 2021-07-24 DIAGNOSIS — G894 Chronic pain syndrome: Secondary | ICD-10-CM | POA: Diagnosis present

## 2021-07-24 MED ORDER — GABAPENTIN 300 MG PO CAPS: 300.0000 mg | ORAL_CAPSULE | Freq: Three times a day (TID) | ORAL | 5 refills | Status: DC

## 2021-07-24 NOTE — Progress Notes (Signed)
PROVIDER NOTE: Information contained herein reflects review and annotations entered in association with encounter. Interpretation of such information and data should be left to medically-trained personnel. Information provided to patient can be located elsewhere in the medical record under "Patient Instructions". Document created using STT-dictation technology, any transcriptional errors that may result from process are unintentional.    Patient: Nicholas Knapp  Service Category: E/M  Provider: Edward Jolly, MD  DOB: Jul 04, 1956  DOS: 07/24/2021  Specialty: Interventional Pain Management  MRN: [id number]  Setting: Ambulatory outpatient  PCP: Lynnea Ferrier, MD  Type: Established Patient    Referring Provider: Lynnea Ferrier, MD  Location: Office  Delivery: Face-to-face     HPI  Nicholas Knapp, a 65 y.o. year old male, is here today because of his Lumbar facet joint syndrome [M47.816]. [name] primary complain today is Back Pain (Lower right) Last encounter: My last encounter with him was on 01/30/2021. Pertinent problems: [name] has Acute right-sided low back pain with right-sided sciatica; Coronary artery disease involving native coronary artery of native heart with angina pectoris [facility]); CAD (coronary artery disease), autologous vein bypass graft; Lateral epicondylitis, left elbow; Lumbar radicular pain; Lumbar facet joint syndrome; Rotator cuff tendinitis, right; Strain of right shoulder; and Type 2 diabetes mellitus with microalbuminuria (HCC) on their pertinent problem list. Pain Assessment: Severity of   is reported as a 0-No pain/10. Location: Back Right, Lower/denies. Onset:  . Quality:  . Timing:  . Modifying factor(s): meds. Vitals:  height is 5\' 9"  (1.753 m) and weight is 197 lb (89.4 kg). His temporal temperature is 96.9 F (36.1 C) (abnormal). His blood pressure is 113/65 and his pulse is 65. His respiration is 15 and oxygen saturation is 100%.   Reason for  encounter:  Mr. presents today for medication management.  Since his last visit with me, he has had cataract surgery.  He is also followed up with cardiology.  He is 12 months out from his drug-eluting stent so elected interventional pain procedures are now an option.  He states that overall his low back is doing well.  He has intermittent pain in his right lower lumbar spine overlying L5-S1.  His lumbar MRI reveals significant L5-S1 facet arthropathy in this region.  We have discussed a diagnostic lumbar facet medial branch nerve block on the right at L4-L5.  Given that his pain is well controlled and manageable at this time, recommend monitoring his symptoms.  Patient expresses to wean his gabapentin.  He is currently taking 300 mg in the morning, 600 mg in the afternoon, 600 mg in the evening.  I instructed him to reduce his dose to 300 mg 3 times daily for 4 weeks then 300 mg twice daily for 4 weeks then 300 mg nightly.  We will touch base in 3 months to see how he is doing.   ROS  Constitutional: Denies any fever or chills Gastrointestinal: No reported hemesis, hematochezia, vomiting, or acute GI distress Musculoskeletal:  Right low back pain, pain with facet loading Neurological: No reported episodes of acute onset apraxia, aphasia, dysarthria, agnosia, amnesia, paralysis, loss of coordination, or loss of consciousness  Medication Review  DULoxetine, EPINEPHrine, aspirin, clopidogrel, diphenhydrAMINE, gabapentin, glimepiride, glucose blood, losartan, metoprolol succinate, nitroGLYCERIN, pantoprazole, polycarbophil, rosuvastatin, and traZODone  History Review  Allergy: [name] is allergic to bee venom, ace inhibitors, isosorbide, lamotrigine, metformin and related, bupropion, and crestor [rosuvastatin calcium]. Drug: [name]  reports no history of drug  use. Alcohol:  reports current alcohol use. Tobacco:  reports that he quit smoking about 26 years ago. His smoking use included  cigarettes. He has a 25.00 pack-year smoking history. He has never used smokeless tobacco. Social: [name]  reports that he quit smoking about 26 years ago. His smoking use included cigarettes. He has a 25.00 pack-year smoking history. He has never used smokeless tobacco. He reports current alcohol use. He reports that he does not use drugs. Medical:  has a past medical history of Allergic state, Anxiety, Asthma, Coronary artery disease, Depression, Diabetes mellitus without complication (HCC), Diverticulosis, GERD (gastroesophageal reflux disease), Hyperlipemia, Hypertension, Midsystolic murmur, Myocardial infarction (HCC) (1995 and 2008), Prostatitis, Proteinuria, and Sleep apnea. Surgical: [name]  has a past surgical history that includes double bipass; Tonsillectomy; sinus sugery; Colonoscopy with propofol (N/A, 09/13/2016); Colon surgery (2008); Coronary angioplasty; Coronary artery bypass graft (09/28/1994); Esophagogastroduodenoscopy (11/19/2013); and Tendon reconstruction (Right, 03/26/2018). Family: family history includes Cancer in his father and mother; Heart disease in his father and mother.  Recent Imaging Review  MR LUMBAR SPINE WO CONTRAST CLINICAL DATA:  Severe right buttock and leg pain. Unable to sit. Recent epidural injection.  EXAM: MRI LUMBAR SPINE WITHOUT CONTRAST  TECHNIQUE: Multiplanar, multisequence MR imaging of the lumbar spine was performed. No intravenous contrast was administered.  COMPARISON:  MRI lumbar spine dated July 18, 2020.  FINDINGS: Segmentation:  Standard.  Alignment:  Physiologic.  Vertebrae: No fracture, evidence of discitis, or suspicious bone lesion. Scattered vertebral body hemangiomas again noted.  Conus medullaris and cauda equina: Conus extends to the L2 level. Conus and cauda equina appear normal.  Paraspinal and other soft tissues: Negative.  Disc levels:  T12-L1:  Negative.  L1-L2:  Negative.  L2-L3: Unchanged small  right extraforaminal/far lateral disc protrusion encroaching on the exiting right L2 nerve root. Unchanged mild right facet arthropathy. No stenosis.  L3-L4: Unchanged mild disc bulging and bilateral facet arthropathy. Unchanged mild right lateral recess stenosis. No spinal canal or neuroforaminal stenosis.  L4-L5: Unchanged mild disc bulging and moderate bilateral facet arthropathy. Unchanged mild bilateral lateral recess stenosis. No spinal canal or neuroforaminal stenosis.  L5-S1: Unchanged mild disc bulging with superimposed small right extraforaminal disc protrusion contacting the exiting right L5 nerve root. Unchanged severe bilateral facet arthropathy. Unchanged moderate right and mild left neuroforaminal stenosis. No spinal canal stenosis.  IMPRESSION: 1. No significant interval change. Unchanged small right extraforaminal disc protrusion at L5-S1 contacting the exiting right L5 nerve root. Unchanged moderate right and mild left neuroforaminal stenosis at this level. 2. Unchanged small right extraforaminal disc protrusion at L2-L3 encroaching on the exiting right L2 nerve root. 3. Unchanged mild lateral recess stenosis at L3-L4 and L4-L5.  Electronically Signed   By: Obie Dredge M.D.   On: 08/01/2020 12:21 Note: Reviewed        Physical Exam  General appearance: Well nourished, well developed, and well hydrated. In no apparent acute distress Mental status: Alert, oriented x 3 (person, place, & time)       Respiratory: No evidence of acute respiratory distress Eyes: PERLA Vitals: BP 113/65   Pulse 65   Temp (!) 96.9 F (36.1 C) (Temporal)   Resp 15   Ht 5\' 9"  (1.753 m)   Wt 197 lb (89.4 kg)   SpO2 100%   BMI 29.09 kg/m  BMI: Estimated body mass index is 29.09 kg/m as calculated from the following:   Height as of this encounter: 5\' 9"  (1.753 m).  Weight as of this encounter: 197 lb (89.4 kg). Ideal: Ideal body weight: 70.7 kg (155 lb 13.8 oz) Adjusted  ideal body weight: 78.2 kg (172 lb 5.1 oz)  Lumbar Spine Area Exam  Skin & Axial Inspection: No masses, redness, or swelling Alignment: Symmetrical Functional ROM: Unrestricted ROM       Stability: No instability detected Muscle Tone/Strength: Functionally intact. No obvious neuro-muscular anomalies detected. Sensory (Neurological): Musculoskeletal pain pattern Palpation: No palpable anomalies       Provocative Tests: Hyperextension/rotation test: (+) on the right for facet joint pain. Lumbar quadrant test (Kemp's test): (+) on the right for facet joint pain. Lateral bending test: deferred today          Assessment   Status Diagnosis  Controlled Controlled Controlled 1. Lumbar facet joint syndrome   2. Coronary artery disease involving native coronary artery of native heart with angina pectoris (HCC)   3. Lumbar radicular pain (right L5/S1)   4. Lumbar radiculopathy   5. Neuroforaminal stenosis of lumbar spine   6. Chronic pain syndrome      Updated Problems: Problem  Lumbar Facet Joint Syndrome  Coronary Artery Disease Involving Native Coronary Artery of Native Heart With Angina Pectoris (Hcc)   Formatting of this note might be different from the original.  a. CABG 1995 then BMS to distal RCA in 2001. b. NSTEMI September 2008 status post DES to distal RCA, mid RCA, proximal RCA. He also has a chronically occluded LAD with a patent LIMA to the LAD. 5075% OM1 disease. He also has a SVG to the D1 patent. c. Stress echocardiogram in November 2008: 7 minutes achieving target: EF 45%. Fixed defects, no ischemia.     Plan of Care   [name] has a current medication list which includes the following long-term medication(s): diphenhydramine, duloxetine, glimepiride, losartan, metoprolol succinate, nitroglycerin, pantoprazole, rosuvastatin, trazodone, and gabapentin.  Pharmacotherapy (Medications Ordered): Meds ordered this encounter  Medications   gabapentin  (NEURONTIN) 300 MG capsule    Sig: Take 1 capsule (300 mg total) by mouth 3 (three) times daily.    Dispense:  90 capsule    Refill:  5   Reduce gabapentin dose as above. As needed diagnostic right L4-L5 facet medial branch nerve block.  Patient will contact us if he has increased his lumbar spine pain and wants to try a diagnostic facet joint injection in that region.  Follow-up plan:   Return in about 3 months (around 10/24/2021) for Medication Management, virtual.        Recent Visits No visits were found meeting these conditions. Showing recent visits within past 90 days and meeting all other requirements Today's Visits Date Type Provider Dept  07/24/21 Office Visit Edward Jolly, MD [facility]  Showing today's visits and meeting all other requirements Future Appointments No visits were found meeting these conditions. Showing future appointments within next 90 days and meeting all other requirements I discussed the assessment and treatment plan with the patient. The patient was provided an opportunity to ask questions and all were answered. The patient agreed with the plan and demonstrated an understanding of the instructions.  Patient advised to call back or seek an in-person evaluation if the symptoms or condition worsens.  Duration of encounter: .  Note by: Edward Jolly, MD Date: 07/24/2021; Time: 11:45 AM

## 2021-07-24 NOTE — Patient Instructions (Signed)
Gabapentin 300 mg three times a day for 4 weeks Gabapentin 300 mg twice daily for 4 weeks Gabapentin 300 mg bedtime thereafter  Give [place] a call if you have increased low back pain and want to do a facet joint injection

## 2021-07-24 NOTE — Progress Notes (Signed)
Safety precautions to be maintained throughout the outpatient stay will include: orient to surroundings, keep bed in low position, maintain call bell within reach at all times, provide assistance with transfer out of bed and ambulation.  

## 2021-09-27 ENCOUNTER — Ambulatory Visit
Payer: Medicare Other | Attending: Student in an Organized Health Care Education/Training Program | Admitting: Student in an Organized Health Care Education/Training Program

## 2021-09-27 ENCOUNTER — Encounter: Payer: Self-pay | Admitting: Student in an Organized Health Care Education/Training Program

## 2021-09-27 ENCOUNTER — Other Ambulatory Visit: Payer: Self-pay

## 2021-09-27 VITALS — BP 118/70 | HR 62 | Temp 97.2°F | Resp 16 | Ht 69.0 in | Wt 197.0 lb

## 2021-09-27 DIAGNOSIS — G894 Chronic pain syndrome: Secondary | ICD-10-CM | POA: Diagnosis not present

## 2021-09-27 DIAGNOSIS — M5416 Radiculopathy, lumbar region: Secondary | ICD-10-CM | POA: Diagnosis not present

## 2021-09-27 DIAGNOSIS — M47816 Spondylosis without myelopathy or radiculopathy, lumbar region: Secondary | ICD-10-CM

## 2021-09-27 DIAGNOSIS — Z955 Presence of coronary angioplasty implant and graft: Secondary | ICD-10-CM

## 2021-09-27 DIAGNOSIS — M48061 Spinal stenosis, lumbar region without neurogenic claudication: Secondary | ICD-10-CM

## 2021-09-27 MED ORDER — GABAPENTIN 600 MG PO TABS: 600.0000 mg | ORAL_TABLET | Freq: Three times a day (TID) | ORAL | 2 refills | Status: AC

## 2021-09-27 NOTE — Progress Notes (Signed)
PROVIDER NOTE: Information contained herein reflects review and annotations entered in association with encounter. Interpretation of such information and data should be left to medically-trained personnel. Information provided to patient can be located elsewhere in the medical record under "Patient Instructions". Document created using STT-dictation technology, any transcriptional errors that may result from process are unintentional.    Patient: Nicholas Knapp  Service Category: E/M  Provider: Edward Jolly, MD  DOB: Jun 02, 1956  DOS: 09/27/2021  Specialty: Interventional Pain Management  MRN: [id number]  Setting: Ambulatory outpatient  PCP: Lynnea Ferrier, MD  Type: Established Patient    Referring Provider: Lynnea Ferrier, MD  Location: Office  Delivery: Face-to-face     HPI  [name], a 65 y.o. year old male, is here today because of his Lumbar radicular pain [M54.16]. [name]'s primary complain today is Back Pain (lower) Last encounter: My last encounter with him was on 07/24/21. Pertinent problems: [name] has Acute right-sided low back pain with right-sided sciatica; Coronary artery disease involving native coronary artery of native heart with angina pectoris [facility]); CAD (coronary artery disease), autologous vein bypass graft; Lateral epicondylitis, left elbow; Lumbar radicular pain; Lumbar facet joint syndrome; Rotator cuff tendinitis, right; Strain of right shoulder; and Type 2 diabetes mellitus with microalbuminuria (HCC) on their pertinent problem list. Pain Assessment: Severity of Chronic pain is reported as a 1 /10. Location: Back Right, Lower/Denies. Onset: More than a month ago. Quality: Aching, Constant. Timing: Constant. Modifying factor(s): rest and meds. Vitals:  height is 5\' 9"  (1.753 m) and weight is 197 lb (89.4 kg). His temperature is 97.2 F (36.2 C) (abnormal). His blood pressure is 118/70 and his pulse is 62. His respiration is 16 and oxygen saturation is  100%.   Reason for encounter: Patient was doing well up until a couple of weeks ago when he woke up and noticed increased pain in his right leg and his right buttock region.  He states that his leg also felt weak and gave out and that he fell.  He states that this is been happening more frequently.  Patient has multilevel lumbar spinal stenosis as well as neuroforaminal stenosis that is likely contributing to his symptoms.  Also has significant lumbar degenerative disc disease.  He has an upcoming cardiac cath placed.  We are fairly limited in our interventional spinal options as he is on dual antiplatelet therapy and will likely need to remain he gets a stent placed this upcoming Monday.  Discussed piriformis release exercises for his right buttock pain that related to piriformis syndrome.  Otherwise recommend increase gabapentin to 600 mg 3 times daily.  Future considerations include low-dose opioid trial   Constitutional: Denies any fever or chills Gastrointestinal: No reported hemesis, hematochezia, vomiting, or acute GI distress Musculoskeletal:  Right low back pain, pain with facet loading Neurological: No reported episodes of acute onset apraxia, aphasia, dysarthria, agnosia, amnesia, paralysis, loss of coordination, or loss of consciousness  Medication Review  DULoxetine, EPINEPHrine, Empagliflozin, aspirin, clopidogrel, diphenhydrAMINE, gabapentin, glimepiride, glucose blood, losartan, metoprolol succinate, nitroGLYCERIN, pantoprazole, polycarbophil, rosuvastatin, and traZODone  History Review  Allergy: [name] is allergic to bee venom, ace inhibitors, isosorbide, lamotrigine, metformin and related, bupropion, and crestor [rosuvastatin calcium]. Drug: [name]  reports no history of drug use. Alcohol:  reports current alcohol use. Tobacco:  reports that he quit smoking about 27 years ago. His smoking use included cigarettes. He has a 25.00 pack-year smoking history. He has never  used smokeless tobacco. Social: [name]  reports that he quit smoking about 27 years ago. His smoking use included cigarettes. He has a 25.00 pack-year smoking history. He has never used smokeless tobacco. He reports current alcohol use. He reports that he does not use drugs. Medical:  has a past medical history of Allergic state, Anxiety, Asthma, Coronary artery disease, Depression, Diabetes mellitus without complication (HCC), Diverticulosis, GERD (gastroesophageal reflux disease), Hyperlipemia, Hypertension, Midsystolic murmur, Myocardial infarction (HCC) (1995 and 2008), Prostatitis, Proteinuria, and Sleep apnea. Surgical: [name]  has a past surgical history that includes double bipass; Tonsillectomy; sinus sugery; Colonoscopy with propofol (N/A, 09/13/2016); Colon surgery (2008); Coronary angioplasty; Coronary artery bypass graft (09/28/1994); Esophagogastroduodenoscopy (11/19/2013); and Tendon reconstruction (Right, 03/26/2018). Family: family history includes Cancer in his father and mother; Heart disease in his father and mother.  Recent Imaging Review  MR LUMBAR SPINE WO CONTRAST CLINICAL DATA:  Severe right buttock and leg pain. Unable to sit. Recent epidural injection.  EXAM: MRI LUMBAR SPINE WITHOUT CONTRAST  TECHNIQUE: Multiplanar, multisequence MR imaging of the lumbar spine was performed. No intravenous contrast was administered.  COMPARISON:  MRI lumbar spine dated July 18, 2020.  FINDINGS: Segmentation:  Standard.  Alignment:  Physiologic.  Vertebrae: No fracture, evidence of discitis, or suspicious bone lesion. Scattered vertebral body hemangiomas again noted.  Conus medullaris and cauda equina: Conus extends to the L2 level. Conus and cauda equina appear normal.  Paraspinal and other soft tissues: Negative.  Disc levels:  T12-L1:  Negative.  L1-L2:  Negative.  L2-L3: Unchanged small right extraforaminal/far lateral disc protrusion encroaching on the  exiting right L2 nerve root. Unchanged mild right facet arthropathy. No stenosis.  L3-L4: Unchanged mild disc bulging and bilateral facet arthropathy. Unchanged mild right lateral recess stenosis. No spinal canal or neuroforaminal stenosis.  L4-L5: Unchanged mild disc bulging and moderate bilateral facet arthropathy. Unchanged mild bilateral lateral recess stenosis. No spinal canal or neuroforaminal stenosis.  L5-S1: Unchanged mild disc bulging with superimposed small right extraforaminal disc protrusion contacting the exiting right L5 nerve root. Unchanged severe bilateral facet arthropathy. Unchanged moderate right and mild left neuroforaminal stenosis. No spinal canal stenosis.  IMPRESSION: 1. No significant interval change. Unchanged small right extraforaminal disc protrusion at L5-S1 contacting the exiting right L5 nerve root. Unchanged moderate right and mild left neuroforaminal stenosis at this level. 2. Unchanged small right extraforaminal disc protrusion at L2-L3 encroaching on the exiting right L2 nerve root. 3. Unchanged mild lateral recess stenosis at L3-L4 and L4-L5.  Electronically Signed   By: Obie Dredge M.D.   On: 08/01/2020 12:21 Note: Reviewed        Physical Exam  General appearance: Well nourished, well developed, and well hydrated. In no apparent acute distress Mental status: Alert, oriented x 3 (person, place, & time)       Respiratory: No evidence of acute respiratory distress Eyes: PERLA Vitals: BP 118/70   Pulse 62   Temp (!) 97.2 F (36.2 C)   Resp 16   Ht 5\' 9"  (1.753 m)   Wt 197 lb (89.4 kg)   SpO2 100%   BMI 29.09 kg/m  BMI: Estimated body mass index is 29.09 kg/m as calculated from the following:   Height as of this encounter: 5\' 9"  (1.753 m).   Weight as of this encounter: 197 lb (89.4 kg). Ideal: Ideal body weight: 70.7 kg (155 lb 13.8 oz) Adjusted ideal body weight: 78.2 kg (172 lb 5.1 oz)  Lumbar Spine Area Exam  Skin &  Axial Inspection: No masses, redness, or swelling Alignment: Symmetrical Functional ROM: Unrestricted ROM       Stability: No instability detected Muscle Tone/Strength: Functionally intact. No obvious neuro-muscular anomalies detected. Sensory (Neurological): Musculoskeletal pain pattern and dermatomal on the right Palpation: No palpable anomalies       Provocative Tests: Hyperextension/rotation test: (+) on the right for facet joint pain. Lumbar quadrant test (Kemp's test): (+) on the right for facet joint pain. Lateral bending test: deferred today          Assessment   Status Diagnosis  Worsening Persistent Having a Flare-up 1. Lumbar radicular pain (right L5/S1)   2. Lumbar facet joint syndrome   3. Lumbar radiculopathy   4. Neuroforaminal stenosis of lumbar spine   5. Chronic pain syndrome   6. S/P drug eluting coronary stent placement        Plan of Care   [name] has a current medication list which includes the following long-term medication(s): diphenhydramine, duloxetine, gabapentin, glimepiride, losartan, metoprolol succinate, nitroglycerin, pantoprazole, rosuvastatin, and trazodone.  Pharmacotherapy (Medications Ordered): Meds ordered this encounter  Medications   gabapentin (NEURONTIN) 600 MG tablet    Sig: Take 1 tablet (600 mg total) by mouth every 8 (eight) hours.    Dispense:  90 tablet    Refill:  2    Fill one day early if pharmacy is closed on scheduled refill date. May substitute for generic if available.   Follow-up plan:   Return in about 3 months (around 12/28/2021) for Medication Management, in person.        Recent Visits Date Type Provider Dept  07/24/21 Office Visit Edward Jolly, MD [facility]  Showing recent visits within past 90 days and meeting all other requirements Today's Visits Date Type Provider Dept  09/27/21 Office Visit Edward Jolly, MD [facility]  Showing today's visits and meeting all other  requirements Future Appointments Date Type Provider Dept  12/25/21 Appointment Edward Jolly, MD [facility]  Showing future appointments within next 90 days and meeting all other requirements I discussed the assessment and treatment plan with the patient. The patient was provided an opportunity to ask questions and all were answered. The patient agreed with the plan and demonstrated an understanding of the instructions.  Patient advised to call back or seek an in-person evaluation if the symptoms or condition worsens.  Duration of encounter: .  Note by: Edward Jolly, MD Date: 09/27/2021; Time: 11:31 AM

## 2021-09-27 NOTE — Progress Notes (Signed)
Safety precautions to be maintained throughout the outpatient stay will include: orient to surroundings, keep bed in low position, maintain call bell within reach at all times, provide assistance with transfer out of bed and ambulation.  

## 2021-10-23 ENCOUNTER — Telehealth: Payer: Medicare Other | Admitting: Student in an Organized Health Care Education/Training Program

## 2021-11-08 ENCOUNTER — Encounter: Payer: Medicare Other | Attending: Cardiovascular Disease

## 2021-11-08 ENCOUNTER — Other Ambulatory Visit: Payer: Self-pay

## 2021-11-08 DIAGNOSIS — Z955 Presence of coronary angioplasty implant and graft: Secondary | ICD-10-CM

## 2021-11-08 DIAGNOSIS — Z48812 Encounter for surgical aftercare following surgery on the circulatory system: Secondary | ICD-10-CM | POA: Insufficient documentation

## 2021-11-08 DIAGNOSIS — Z951 Presence of aortocoronary bypass graft: Secondary | ICD-10-CM | POA: Insufficient documentation

## 2021-11-08 NOTE — Progress Notes (Signed)
Virtual Visit completed. Patient informed on EP and RD appointment and 6 Minute walk test. Patient also informed of patient health questionnaires on My Chart. Patient Verbalizes understanding. Visit diagnosis can be found in [facility] 10/01/2021. 

## 2021-11-26 ENCOUNTER — Other Ambulatory Visit: Payer: Self-pay

## 2021-11-26 ENCOUNTER — Encounter: Payer: Medicare Other | Admitting: *Deleted

## 2021-11-26 VITALS — Ht 69.8 in | Wt 197.4 lb

## 2021-11-26 DIAGNOSIS — Z955 Presence of coronary angioplasty implant and graft: Secondary | ICD-10-CM

## 2021-11-26 DIAGNOSIS — Z48812 Encounter for surgical aftercare following surgery on the circulatory system: Secondary | ICD-10-CM | POA: Diagnosis present

## 2021-11-26 DIAGNOSIS — Z951 Presence of aortocoronary bypass graft: Secondary | ICD-10-CM | POA: Diagnosis not present

## 2021-11-26 NOTE — Patient Instructions (Signed)
Patient Instructions  Patient Details  Name: Nicholas Knapp MRN: [id number] Date of Birth: [birth year] Referring Provider:  Francesco Runner, MD  Below are your personal goals for exercise, nutrition, and risk factors. Our goal is to help you stay on track towards obtaining and maintaining these goals. We will be discussing your progress on these goals with you throughout the program.  Initial Exercise Prescription:  Initial Exercise Prescription - 11/26/21 1100       Date of Initial Exercise RX and Referring Provider   Date 11/26/21    Referring Provider Youlanda Mighty MD      Oxygen   Maintain Oxygen Saturation 88% or higher      Treadmill   MPH 2.2    Grade 0.5    Minutes 15    METs 2.84      Recumbant Elliptical   Level 1    RPM 50    Minutes 15    METs 2.5      REL-XR   Level 2    Speed 50    Minutes 15    METs 2.5      Prescription Details   Frequency (times per week) 2    Duration Progress to 30 minutes of continuous aerobic without signs/symptoms of physical distress      Intensity   THRR 40-80% of Max Heartrate 101-136    Ratings of Perceived Exertion 11-13    Perceived Dyspnea 0-4      Progression   Progression Continue to progress workloads to maintain intensity without signs/symptoms of physical distress.      Resistance Training   Training Prescription Yes    Weight 4 lb    Reps 10-15             Exercise Goals: Frequency: Be able to perform aerobic exercise two to three times per week in program working toward 2-5 days per week of home exercise.  Intensity: Work with a perceived exertion of 11 (fairly light) - 15 (hard) while following your exercise prescription.  We will make changes to your prescription with you as you progress through the program.   Duration: Be able to do 30 to 45 minutes of continuous aerobic exercise in addition to a 5 minute warm-up and a 5 minute cool-down routine.   Nutrition Goals: Your personal nutrition  goals will be established when you do your nutrition analysis with the dietician.  The following are general nutrition guidelines to follow: Cholesterol < 200mg /day Sodium < 1500mg /day Fiber: Men over 50 yrs - 30 grams per day  Personal Goals:  Personal Goals and Risk Factors at Admission - 11/26/21 1116       Core Components/Risk Factors/Patient Goals on Admission    Weight Management Yes;Weight Loss;Weight Maintenance    Intervention Weight Management: Develop a combined nutrition and exercise program designed to reach desired caloric intake, while maintaining appropriate intake of nutrient and fiber, sodium and fats, and appropriate energy expenditure required for the weight goal.;Weight Management: Provide education and appropriate resources to help participant work on and attain dietary goals.;Weight Management/Obesity: Establish reasonable short term and long term weight goals.    Admit Weight 197 lb 6.4 oz (89.5 kg)    Goal Weight: Short Term 192 lb (87.1 kg)    Goal Weight: Long Term 190 lb (86.2 kg)    Expected Outcomes Long Term: Adherence to nutrition and physical activity/exercise program aimed toward attainment of established weight goal;Short Term: Continue to assess and modify interventions until  short term weight is achieved;Weight Loss: Understanding of general recommendations for a balanced deficit meal plan, which promotes 1-2 lb weight loss per week and includes a negative energy balance of [phone] kcal/d;Understanding recommendations for meals to include 15-35% energy as protein, 25-35% energy from fat, 35-60% energy from carbohydrates, less than 200mg  of dietary cholesterol, 20-35 gm of total fiber daily;Understanding of distribution of calorie intake throughout the day with the consumption of 4-5 meals/snacks    Diabetes Yes    Intervention Provide education about signs/symptoms and action to take for hypo/hyperglycemia.;Provide education about proper nutrition, including  hydration, and aerobic/resistive exercise prescription along with prescribed medications to achieve blood glucose in normal ranges: Fasting glucose 65-99 mg/dL    Expected Outcomes Short Term: Participant verbalizes understanding of the signs/symptoms and immediate care of hyper/hypoglycemia, proper foot care and importance of medication, aerobic/resistive exercise and nutrition plan for blood glucose control.;Long Term: Attainment of HbA1C < 7%.    Hypertension Yes    Intervention Provide education on lifestyle modifcations including regular physical activity/exercise, weight management, moderate sodium restriction and increased consumption of fresh fruit, vegetables, and low fat dairy, alcohol moderation, and smoking cessation.;Monitor prescription use compliance.    Expected Outcomes Short Term: Continued assessment and intervention until BP is < 140/23mm HG in hypertensive participants. < 130/44mm HG in hypertensive participants with diabetes, heart failure or chronic kidney disease.;Long Term: Maintenance of blood pressure at goal levels.    Lipids Yes    Intervention Provide education and support for participant on nutrition & aerobic/resistive exercise along with prescribed medications to achieve LDL 70mg , HDL >40mg .    Expected Outcomes Short Term: Participant states understanding of desired cholesterol values and is compliant with medications prescribed. Participant is following exercise prescription and nutrition guidelines.;Long Term: Cholesterol controlled with medications as prescribed, with individualized exercise RX and with personalized nutrition plan. Value goals: LDL < 70mg , HDL > 40 mg.             Tobacco Use Initial Evaluation: Social History   Tobacco Use  Smoking Status Former   Packs/day: 1.00   Years: 25.00   Pack years: 25.00   Types: Cigarettes   Quit date: 09/23/1994   Years since quitting: 27.1  Smokeless Tobacco Never    Exercise Goals and Review:   Exercise Goals     Row Name 11/26/21 1114             Exercise Goals   Increase Physical Activity Yes       Intervention Provide advice, education, support and counseling about physical activity/exercise needs.;Develop an individualized exercise prescription for aerobic and resistive training based on initial evaluation findings, risk stratification, comorbidities and participant's personal goals.       Expected Outcomes Short Term: Attend rehab on a regular basis to increase amount of physical activity.;Long Term: Exercising regularly at least 3-5 days a week.;Long Term: Add in home exercise to make exercise part of routine and to increase amount of physical activity.       Increase Strength and Stamina Yes       Intervention Provide advice, education, support and counseling about physical activity/exercise needs.;Develop an individualized exercise prescription for aerobic and resistive training based on initial evaluation findings, risk stratification, comorbidities and participant's personal goals.       Expected Outcomes Short Term: Increase workloads from initial exercise prescription for resistance, speed, and METs.;Short Term: Perform resistance training exercises routinely during rehab and add in resistance training at home;Long Term: Improve cardiorespiratory  fitness, muscular endurance and strength as measured by increased METs and functional capacity ( )       Able to understand and use rate of perceived exertion (RPE) scale Yes       Intervention Provide education and explanation on how to use RPE scale       Expected Outcomes Short Term: Able to use RPE daily in rehab to express subjective intensity level;Long Term:  Able to use RPE to guide intensity level when exercising independently       Able to understand and use Dyspnea scale Yes       Intervention Provide education and explanation on how to use Dyspnea scale       Expected Outcomes Short Term: Able to use Dyspnea scale  daily in rehab to express subjective sense of shortness of breath during exertion;Long Term: Able to use Dyspnea scale to guide intensity level when exercising independently       Knowledge and understanding of Target Heart Rate Range (THRR) Yes       Intervention Provide education and explanation of THRR including how the numbers were predicted and where they are located for reference       Expected Outcomes Short Term: Able to state/look up THRR;Short Term: Able to use daily as guideline for intensity in rehab;Long Term: Able to use THRR to govern intensity when exercising independently       Able to check pulse independently Yes       Intervention Provide education and demonstration on how to check pulse in carotid and radial arteries.;Review the importance of being able to check your own pulse for safety during independent exercise       Expected Outcomes Short Term: Able to explain why pulse checking is important during independent exercise;Long Term: Able to check pulse independently and accurately       Understanding of Exercise Prescription Yes       Intervention Provide education, explanation, and written materials on patient's individual exercise prescription       Expected Outcomes Short Term: Able to explain program exercise prescription;Long Term: Able to explain home exercise prescription to exercise independently                Copy of goals given to participant.

## 2021-11-26 NOTE — Progress Notes (Signed)
Cardiac Individual Treatment Plan  Patient Details  Name: Nicholas Knapp MRN: [id number] Date of Birth: 10-Feb-1956 Referring Provider:   Flowsheet Row Cardiac Rehab from 11/26/2021 in [facility] Cardiac and Pulmonary Rehab  Referring Provider Cloretta Ned MD       Initial Encounter Date:  Flowsheet Row Cardiac Rehab from 11/26/2021 in [facility] Cardiac and Pulmonary Rehab  Date 11/26/21       Visit Diagnosis: Status post coronary artery stent placement  Patient's Home Medications on Admission:  Current Outpatient Medications:    aspirin 81 MG tablet, Take 81 mg by mouth every evening. , Disp: , Rfl:    azelastine (ASTELIN) 0.1 % nasal spray, Place into the nose., Disp: , Rfl:    CALCIUM POLYCARBOPHIL PO, Take 1 tablet by mouth daily., Disp: , Rfl:    clopidogrel (PLAVIX) 75 MG tablet, Take 75 mg by mouth daily., Disp: , Rfl:    clopidogrel (PLAVIX) 75 MG tablet, Take 1 tablet by mouth daily. (Patient not taking: Reported on 11/08/2021), Disp: , Rfl:    diphenhydrAMINE (BENADRYL) 25 MG tablet, Take 25 mg by mouth daily as needed for allergies., Disp: , Rfl:    DULoxetine (CYMBALTA) 60 MG capsule, Take 60 mg by mouth daily., Disp: , Rfl:    DULoxetine (CYMBALTA) 60 MG capsule, Take 1 capsule by mouth daily. (Patient not taking: Reported on 11/08/2021), Disp: , Rfl:    Empagliflozin (JARDIANCE PO), Take 20 mg by mouth daily., Disp: , Rfl:    EPINEPHrine 0.3 mg/0.3 mL IJ SOAJ injection, Inject into the muscle once., Disp: , Rfl:    gabapentin (NEURONTIN) 600 MG tablet, Take 1 tablet (600 mg total) by mouth every 8 (eight) hours., Disp: 90 tablet, Rfl: 2   gabapentin (NEURONTIN) 600 MG tablet, 2 (two) times daily (Patient not taking: Reported on 11/08/2021), Disp: , Rfl:    glimepiride (AMARYL) 2 MG tablet, Take 2 mg by mouth daily with breakfast., Disp: , Rfl:    glucose blood test strip, 1 each by Other route as needed for other. Use as instructed, Disp: , Rfl:    JARDIANCE 25 MG TABS  tablet, Take 25 mg by mouth daily., Disp: , Rfl:    losartan (COZAAR) 100 MG tablet, Take 100 mg by mouth daily., Disp: , Rfl:    losartan (COZAAR) 25 MG tablet, Take 25 mg by mouth every evening.  (Patient not taking: Reported on 11/08/2021), Disp: , Rfl:    metoprolol succinate (TOPROL-XL) 25 MG 24 hr tablet, Take 25 mg by mouth every evening. , Disp: , Rfl:    nitroGLYCERIN (NITROSTAT) 0.4 MG SL tablet, Place 0.4 mg under the tongue every 5 (five) minutes as needed for chest pain., Disp: , Rfl:    pantoprazole (PROTONIX) 40 MG tablet, Take 40 mg by mouth daily. , Disp: , Rfl:    pantoprazole (PROTONIX) 40 MG tablet, Take 1 tablet by mouth at bedtime. (Patient not taking: Reported on 11/08/2021), Disp: , Rfl:    polycarbophil (FIBERCON) 625 MG tablet, Take 625 mg by mouth every evening., Disp: , Rfl:    rosuvastatin (CRESTOR) 20 MG tablet, Take 20 mg by mouth every evening. , Disp: , Rfl:    spironolactone (ALDACTONE) 25 MG tablet, Take by mouth., Disp: , Rfl:    STUDY - ASPIRE - aspirin 81 mg or placebo tablet (PI-Sethi), Take 1 tablet by mouth every evening., Disp: , Rfl:    traZODone (DESYREL) 50 MG tablet, Take 50 mg by mouth at bedtime as  needed for sleep., Disp: , Rfl:   Past Medical History: Past Medical History:  Diagnosis Date   Allergic state    Anxiety    Asthma    Coronary artery disease    Depression    Diabetes mellitus without complication (HCC)    Diverticulosis    GERD (gastroesophageal reflux disease)    Hyperlipemia    Hypertension    Midsystolic murmur    Myocardial infarction ([place]) 1995 and 2008   Prostatitis    Proteinuria    Sleep apnea     Tobacco Use: Social History   Tobacco Use  Smoking Status Former   Packs/day: 1.00   Years: 25.00   Pack years: 25.00   Types: Cigarettes   Quit date: 09/23/1994   Years since quitting: 27.1  Smokeless Tobacco Never    Labs: Recent Review Flowsheet Data   There is no flowsheet data to display.       Exercise Target Goals: Exercise Program Goal: Individual exercise prescription set using results from initial 6 min walk test and THRR while considering  patients activity barriers and safety.   Exercise Prescription Goal: Initial exercise prescription builds to 30-45 minutes a day of aerobic activity, 2-3 days per week.  Home exercise guidelines will be given to patient during program as part of exercise prescription that the participant will acknowledge.   Education: Aerobic Exercise: - Group verbal and visual presentation on the components of exercise prescription. Introduces F.I.T.T principle from ACSM for exercise prescriptions.  Reviews F.I.T.T. principles of aerobic exercise including progression. Written material given at graduation. Flowsheet Row Cardiac Rehab from 11/26/2021 in [facility] Cardiac and Pulmonary Rehab  Education need identified 11/26/21       Education: Resistance Exercise: - Group verbal and visual presentation on the components of exercise prescription. Introduces F.I.T.T principle from ACSM for exercise prescriptions  Reviews F.I.T.T. principles of resistance exercise including progression. Written material given at graduation.    Education: Exercise & Equipment Safety: - Individual verbal instruction and demonstration of equipment use and safety with use of the equipment. Flowsheet Row Cardiac Rehab from 11/26/2021 in [facility] Cardiac and Pulmonary Rehab  Date 11/08/21  Educator [facility]  Instruction Review Code 1- Verbalizes Understanding       Education: Exercise Physiology & General Exercise Guidelines: - Group verbal and written instruction with models to review the exercise physiology of the cardiovascular system and associated critical values. Provides general exercise guidelines with specific guidelines to those with heart or lung disease.    Education: Flexibility, Balance, Mind/Body Relaxation: - Group verbal and visual presentation with interactive  activity on the components of exercise prescription. Introduces F.I.T.T principle from ACSM for exercise prescriptions. Reviews F.I.T.T. principles of flexibility and balance exercise training including progression. Also discusses the mind body connection.  Reviews various relaxation techniques to help reduce and manage stress (i.e. Deep breathing, progressive muscle relaxation, and visualization). Balance handout provided to take home. Written material given at graduation.   Activity Barriers & Risk Stratification:  Activity Barriers & Cardiac Risk Stratification - 11/26/21 1111       Activity Barriers & Cardiac Risk Stratification   Activity Barriers Muscular Weakness;Deconditioning;Shortness of Breath;Back Problems;Neck/Spine Problems;Balance Concerns   chronic back pain   Cardiac Risk Stratification High             6 Minute Walk:  6 Minute Walk     Row Name 11/26/21 1110         6 Minute Walk   Phase  Initial     Distance 1275 feet     Walk Time 6 minutes     # of Rest Breaks 0     MPH 2.41     METS 3     RPE 11     Perceived Dyspnea  1     VO2 Peak 10.53     Symptoms Yes (comment)     Comments SOB     Resting HR 66 bpm     Resting BP 108/64     Resting Oxygen Saturation  97 %     Exercise Oxygen Saturation  during 6 min walk 96 %     Max Ex. HR 128 bpm     Max Ex. BP 122/68     2 Minute Post BP 112/60              Oxygen Initial Assessment:   Oxygen Re-Evaluation:   Oxygen Discharge (Final Oxygen Re-Evaluation):   Initial Exercise Prescription:  Initial Exercise Prescription - 11/26/21 1100       Date of Initial Exercise RX and Referring Provider   Date 11/26/21    Referring Provider Cloretta Ned MD      Oxygen   Maintain Oxygen Saturation 88% or higher      Treadmill   MPH 2.2    Grade 0.5    Minutes 15    METs 2.84      Recumbant Elliptical   Level 1    RPM 50    Minutes 15    METs 2.5      REL-XR   Level 2    Speed 50     Minutes 15    METs 2.5      Prescription Details   Frequency (times per week) 2    Duration Progress to 30 minutes of continuous aerobic without signs/symptoms of physical distress      Intensity   THRR 40-80% of Max Heartrate 101-136    Ratings of Perceived Exertion 11-13    Perceived Dyspnea 0-4      Progression   Progression Continue to progress workloads to maintain intensity without signs/symptoms of physical distress.      Resistance Training   Training Prescription Yes    Weight 4 lb    Reps 10-15             Perform Capillary Blood Glucose checks as needed.  Exercise Prescription Changes:   Exercise Prescription Changes     Row Name 11/26/21 1100             Response to Exercise   Blood Pressure (Admit) 108/64       Blood Pressure (Exercise) 122/68       Blood Pressure (Exit) 112/60       Heart Rate (Admit) 66 bpm       Heart Rate (Exercise) 128 bpm       Heart Rate (Exit) 66 bpm       Oxygen Saturation (Admit) 97 %       Oxygen Saturation (Exercise) 96 %       Rating of Perceived Exertion (Exercise) 11       Perceived Dyspnea (Exercise) 1       Symptoms SOB       Comments walk test results                Exercise Comments:   Exercise Goals and Review:   Exercise Goals     Row Name 11/26/21  1114             Exercise Goals   Increase Physical Activity Yes       Intervention Provide advice, education, support and counseling about physical activity/exercise needs.;Develop an individualized exercise prescription for aerobic and resistive training based on initial evaluation findings, risk stratification, comorbidities and participant's personal goals.       Expected Outcomes Short Term: Attend rehab on a regular basis to increase amount of physical activity.;Long Term: Exercising regularly at least 3-5 days a week.;Long Term: Add in home exercise to make exercise part of routine and to increase amount of physical activity.       Increase  Strength and Stamina Yes       Intervention Provide advice, education, support and counseling about physical activity/exercise needs.;Develop an individualized exercise prescription for aerobic and resistive training based on initial evaluation findings, risk stratification, comorbidities and participant's personal goals.       Expected Outcomes Short Term: Increase workloads from initial exercise prescription for resistance, speed, and METs.;Short Term: Perform resistance training exercises routinely during rehab and add in resistance training at home;Long Term: Improve cardiorespiratory fitness, muscular endurance and strength as measured by increased METs and functional capacity (6MWT)       Able to understand and use rate of perceived exertion (RPE) scale Yes       Intervention Provide education and explanation on how to use RPE scale       Expected Outcomes Short Term: Able to use RPE daily in rehab to express subjective intensity level;Long Term:  Able to use RPE to guide intensity level when exercising independently       Able to understand and use Dyspnea scale Yes       Intervention Provide education and explanation on how to use Dyspnea scale       Expected Outcomes Short Term: Able to use Dyspnea scale daily in rehab to express subjective sense of shortness of breath during exertion;Long Term: Able to use Dyspnea scale to guide intensity level when exercising independently       Knowledge and understanding of Target Heart Rate Range (THRR) Yes       Intervention Provide education and explanation of THRR including how the numbers were predicted and where they are located for reference       Expected Outcomes Short Term: Able to state/look up THRR;Short Term: Able to use daily as guideline for intensity in rehab;Long Term: Able to use THRR to govern intensity when exercising independently       Able to check pulse independently Yes       Intervention Provide education and demonstration on how  to check pulse in carotid and radial arteries.;Review the importance of being able to check your own pulse for safety during independent exercise       Expected Outcomes Short Term: Able to explain why pulse checking is important during independent exercise;Long Term: Able to check pulse independently and accurately       Understanding of Exercise Prescription Yes       Intervention Provide education, explanation, and written materials on patient's individual exercise prescription       Expected Outcomes Short Term: Able to explain program exercise prescription;Long Term: Able to explain home exercise prescription to exercise independently                Exercise Goals Re-Evaluation :   Discharge Exercise Prescription (Final Exercise Prescription Changes):  Exercise Prescription Changes - 11/26/21 1100  Response to Exercise   Blood Pressure (Admit) 108/64    Blood Pressure (Exercise) 122/68    Blood Pressure (Exit) 112/60    Heart Rate (Admit) 66 bpm    Heart Rate (Exercise) 128 bpm    Heart Rate (Exit) 66 bpm    Oxygen Saturation (Admit) 97 %    Oxygen Saturation (Exercise) 96 %    Rating of Perceived Exertion (Exercise) 11    Perceived Dyspnea (Exercise) 1    Symptoms SOB    Comments walk test results             Nutrition:  Target Goals: Understanding of nutrition guidelines, daily intake of sodium 1500mg , cholesterol 200mg , calories 30% from fat and 7% or less from saturated fats, daily to have 5 or more servings of fruits and vegetables.  Education: All About Nutrition: -Group instruction provided by verbal, written material, interactive activities, discussions, models, and posters to present general guidelines for heart healthy nutrition including fat, fiber, MyPlate, the role of sodium in heart healthy nutrition, utilization of the nutrition label, and utilization of this knowledge for meal planning. Follow up email sent as well. Written material given at  graduation. Flowsheet Row Cardiac Rehab from 11/26/2021 in [facility] Cardiac and Pulmonary Rehab  Education need identified 11/26/21       Biometrics:  Pre Biometrics - 11/26/21 1114       Pre Biometrics   Height 5' 9.8" (1.773 m)    Weight 197 lb 6.4 oz (89.5 kg)    BMI (Calculated) 28.48    Single Leg Stand 1.6 seconds              Nutrition Therapy Plan and Nutrition Goals:  Nutrition Therapy & Goals - 11/26/21 1115       Intervention Plan   Intervention Prescribe, educate and counsel regarding individualized specific dietary modifications aiming towards targeted core components such as weight, hypertension, lipid management, diabetes, heart failure and other comorbidities.    Expected Outcomes Short Term Goal: Understand basic principles of dietary content, such as calories, fat, sodium, cholesterol and nutrients.;Short Term Goal: A plan has been developed with personal nutrition goals set during dietitian appointment.;Long Term Goal: Adherence to prescribed nutrition plan.             Nutrition Assessments:  MEDIFICTS Score Key: ?70 Need to make dietary changes  40-70 Heart Healthy Diet ? 40 Therapeutic Level Cholesterol Diet  Flowsheet Row Cardiac Rehab from 11/26/2021 in [facility] Cardiac and Pulmonary Rehab  Picture Your Plate Total Score on Admission 62      Picture Your Plate Scores: [id number] Unhealthy dietary pattern with much room for improvement. 41-50 Dietary pattern unlikely to meet recommendations for good health and room for improvement. 51-60 More healthful dietary pattern, with some room for improvement.  >60 Healthy dietary pattern, although there may be some specific behaviors that could be improved.    Nutrition Goals Re-Evaluation:   Nutrition Goals Discharge (Final Nutrition Goals Re-Evaluation):   Psychosocial: Target Goals: Acknowledge presence or absence of significant depression and/or stress, maximize coping skills, provide positive  support system. Participant is able to verbalize types and ability to use techniques and skills needed for reducing stress and depression.   Education: Stress, Anxiety, and Depression - Group verbal and visual presentation to define topics covered.  Reviews how body is impacted by stress, anxiety, and depression.  Also discusses healthy ways to reduce stress and to treat/manage anxiety and depression.  Written material given at graduation. Flowsheet  Row Cardiac Rehab from 11/26/2021 in [facility] Cardiac and Pulmonary Rehab  Education need identified 11/26/21       Education: Sleep Hygiene -Provides group verbal and written instruction about how sleep can affect your health.  Define sleep hygiene, discuss sleep cycles and impact of sleep habits. Review good sleep hygiene tips.    Initial Review & Psychosocial Screening:  Initial Psych Review & Screening - 11/08/21 0916       Initial Review   Current issues with Current Depression;History of Depression;Current Psychotropic Meds      Family Dynamics   Good Support System? Yes    Comments His health issues have made it hard for him and he takes medication to help cope with depression. He has a good family support system. He can look to his wife and his sister for support.      Barriers   Psychosocial barriers to participate in program The patient should benefit from training in stress management and relaxation.      Screening Interventions   Interventions Provide feedback about the scores to participant;Encouraged to exercise;To provide support and resources with identified psychosocial needs    Expected Outcomes Short Term goal: Utilizing psychosocial counselor, staff and physician to assist with identification of specific Stressors or current issues interfering with healing process. Setting desired goal for each stressor or current issue identified.;Long Term Goal: Stressors or current issues are controlled or eliminated.;Short Term goal:  Identification and review with participant of any Quality of Life or Depression concerns found by scoring the questionnaire.;Long Term goal: The participant improves quality of Life and PHQ9 Scores as seen by post scores and/or verbalization of changes             Quality of Life Scores:   Quality of Life - 11/26/21 1115       Quality of Life   Select Quality of Life      Quality of Life Scores   Health/Function Pre 22.4 %    Socioeconomic Pre 27.86 %    Psych/Spiritual Pre 28.29 %    Family Pre 30 %    GLOBAL Pre 25.73 %            Scores of 19 and below usually indicate a poorer quality of life in these areas.  A difference of  2-3 points is a clinically meaningful difference.  A difference of 2-3 points in the total score of the Quality of Life Index has been associated with significant improvement in overall quality of life, self-image, physical symptoms, and general health in studies assessing change in quality of life.  PHQ-9: Recent Review Flowsheet Data     Depression screen [facility] 2/9 11/26/2021 01/30/2021 10/03/2020   Decreased Interest 1 0 0   Down, Depressed, Hopeless 0 0 0   PHQ - 2 Score 1 0 0   Altered sleeping 1 - -   Tired, decreased energy 1 - -   Change in appetite 0 - -   Feeling bad or failure about yourself  0 - -   Trouble concentrating 0 - -   Moving slowly or fidgety/restless 0 - -   Suicidal thoughts 0 - -   PHQ-9 Score 3 - -   Difficult doing work/chores Somewhat difficult - -      Interpretation of Total Score  Total Score Depression Severity:  1-4 = Minimal depression, 5-9 = Mild depression, 10-14 = Moderate depression, 15-19 = Moderately severe depression, 20-27 = Severe depression   Psychosocial  Evaluation and Intervention:  Psychosocial Evaluation - 11/08/21 0917       Psychosocial Evaluation & Interventions   Interventions Encouraged to exercise with the program and follow exercise prescription;Relaxation education;Stress  management education    Comments His health issues have made it hard for him and he takes medication to help cope with depression. He has a good family support system. He can look to his wife and his sister for support.    Expected Outcomes Short: Start [facility] to help with mood. Long: Maintain a healthy mental state    Continue Psychosocial Services  Follow up required by staff             Psychosocial Re-Evaluation:   Psychosocial Discharge (Final Psychosocial Re-Evaluation):   Vocational Rehabilitation: Provide vocational rehab assistance to qualifying candidates.   Vocational Rehab Evaluation & Intervention:   Education: Education Goals: Education classes will be provided on a variety of topics geared toward better understanding of heart health and risk factor modification. Participant will state understanding/return demonstration of topics presented as noted by education test scores.  Learning Barriers/Preferences:  Learning Barriers/Preferences - 11/08/21 0913       Learning Barriers/Preferences   Learning Barriers None    Learning Preferences None             General Cardiac Education Topics:  AED/CPR: - Group verbal and written instruction with the use of models to demonstrate the basic use of the AED with the basic ABC's of resuscitation.   Anatomy and Cardiac Procedures: - Group verbal and visual presentation and models provide information about basic cardiac anatomy and function. Reviews the testing methods done to diagnose heart disease and the outcomes of the test results. Describes the treatment choices: Medical Management, Angioplasty, or Coronary Bypass Surgery for treating various heart conditions including Myocardial Infarction, Angina, Valve Disease, and Cardiac Arrhythmias.  Written material given at graduation.   Medication Safety: - Group verbal and visual instruction to review commonly prescribed medications for heart and lung disease.  Reviews the medication, class of the drug, and side effects. Includes the steps to properly store meds and maintain the prescription regimen.  Written material given at graduation.   Intimacy: - Group verbal instruction through game format to discuss how heart and lung disease can affect sexual intimacy. Written material given at graduation.. Flowsheet Row Cardiac Rehab from 11/26/2021 in [facility] Cardiac and Pulmonary Rehab  Education need identified 11/26/21       Know Your Numbers and Heart Failure: - Group verbal and visual instruction to discuss disease risk factors for cardiac and pulmonary disease and treatment options.  Reviews associated critical values for Overweight/Obesity, Hypertension, Cholesterol, and Diabetes.  Discusses basics of heart failure: signs/symptoms and treatments.  Introduces Heart Failure Zone chart for action plan for heart failure.  Written material given at graduation. Flowsheet Row Cardiac Rehab from 11/26/2021 in [facility] Cardiac and Pulmonary Rehab  Education need identified 11/26/21       Infection Prevention: - Provides verbal and written material to individual with discussion of infection control including proper hand washing and proper equipment cleaning during exercise session. Flowsheet Row Cardiac Rehab from 11/26/2021 in [facility] Cardiac and Pulmonary Rehab  Date 11/08/21  Educator [facility]  Instruction Review Code 1- Verbalizes Understanding       Falls Prevention: - Provides verbal and written material to individual with discussion of falls prevention and safety. Flowsheet Row Cardiac Rehab from 11/26/2021 in [facility] Cardiac and Pulmonary Rehab  Date 11/08/21  Educator  [facility]  Instruction Review Code 1- Verbalizes Understanding       Other: -Provides group and verbal instruction on various topics (see comments)   Knowledge Questionnaire Score:  Knowledge Questionnaire Score - 11/26/21 1116       Knowledge Questionnaire Score   Pre Score 20/26              Core Components/Risk Factors/Patient Goals at Admission:  Personal Goals and Risk Factors at Admission - 11/26/21 1116       Core Components/Risk Factors/Patient Goals on Admission    Weight Management Yes;Weight Loss;Weight Maintenance    Intervention Weight Management: Develop a combined nutrition and exercise program designed to reach desired caloric intake, while maintaining appropriate intake of nutrient and fiber, sodium and fats, and appropriate energy expenditure required for the weight goal.;Weight Management: Provide education and appropriate resources to help participant work on and attain dietary goals.;Weight Management/Obesity: Establish reasonable short term and long term weight goals.    Admit Weight 197 lb 6.4 oz (89.5 kg)    Goal Weight: Short Term 192 lb (87.1 kg)    Goal Weight: Long Term 190 lb (86.2 kg)    Expected Outcomes Long Term: Adherence to nutrition and physical activity/exercise program aimed toward attainment of established weight goal;Short Term: Continue to assess and modify interventions until short term weight is achieved;Weight Loss: Understanding of general recommendations for a balanced deficit meal plan, which promotes 1-2 lb weight loss per week and includes a negative energy balance of [phone] kcal/d;Understanding recommendations for meals to include 15-35% energy as protein, 25-35% energy from fat, 35-60% energy from carbohydrates, less than 200mg  of dietary cholesterol, 20-35 gm of total fiber daily;Understanding of distribution of calorie intake throughout the day with the consumption of 4-5 meals/snacks    Diabetes Yes    Intervention Provide education about signs/symptoms and action to take for hypo/hyperglycemia.;Provide education about proper nutrition, including hydration, and aerobic/resistive exercise prescription along with prescribed medications to achieve blood glucose in normal ranges: Fasting glucose 65-99 mg/dL    Expected Outcomes  Short Term: Participant verbalizes understanding of the signs/symptoms and immediate care of hyper/hypoglycemia, proper foot care and importance of medication, aerobic/resistive exercise and nutrition plan for blood glucose control.;Long Term: Attainment of HbA1C < 7%.    Hypertension Yes    Intervention Provide education on lifestyle modifcations including regular physical activity/exercise, weight management, moderate sodium restriction and increased consumption of fresh fruit, vegetables, and low fat dairy, alcohol moderation, and smoking cessation.;Monitor prescription use compliance.    Expected Outcomes Short Term: Continued assessment and intervention until BP is < 140/38mm HG in hypertensive participants. < 130/70mm HG in hypertensive participants with diabetes, heart failure or chronic kidney disease.;Long Term: Maintenance of blood pressure at goal levels.    Lipids Yes    Intervention Provide education and support for participant on nutrition & aerobic/resistive exercise along with prescribed medications to achieve LDL 70mg , HDL >40mg .    Expected Outcomes Short Term: Participant states understanding of desired cholesterol values and is compliant with medications prescribed. Participant is following exercise prescription and nutrition guidelines.;Long Term: Cholesterol controlled with medications as prescribed, with individualized exercise RX and with personalized nutrition plan. Value goals: LDL < 70mg , HDL > 40 mg.             Education:Diabetes - Individual verbal and written instruction to review signs/symptoms of diabetes, desired ranges of glucose level fasting, after meals and with exercise. Acknowledge that pre and post exercise glucose checks will be  done for 3 sessions at entry of program. Flowsheet Row Cardiac Rehab from 11/26/2021 in [facility]  Date 11/08/21  Educator [facility]  Instruction Review Code 1- Verbalizes Understanding       Core  Components/Risk Factors/Patient Goals Review:    Core Components/Risk Factors/Patient Goals at Discharge (Final Review):    ITP Comments:  ITP Comments     [place] Name 11/08/21 0912 11/26/21 1109         ITP Comments Virtual Visit completed. Patient informed on EP and RD appointment and 6 Minute walk test. Patient also informed of patient health questionnaires on My Chart. Patient Verbalizes understanding. Visit diagnosis can be found in [facility] 10/01/2021. Completed 6MWT and gym orientation. Initial ITP created and sent for review to Dr. Emily Filbert, Medical Director.               Comments: Initial ITP

## 2021-12-05 ENCOUNTER — Encounter: Payer: Self-pay | Admitting: *Deleted

## 2021-12-05 DIAGNOSIS — Z955 Presence of coronary angioplasty implant and graft: Secondary | ICD-10-CM

## 2021-12-05 NOTE — Progress Notes (Signed)
Cardiac Individual Treatment Plan  Patient Details  Name: Nicholas Knapp MRN: [id number] Date of Birth: 10-Feb-1956 Referring Provider:   Flowsheet Row Cardiac Rehab from 11/26/2021 in [facility] Cardiac and Pulmonary Rehab  Referring Provider Cloretta Ned MD       Initial Encounter Date:  Flowsheet Row Cardiac Rehab from 11/26/2021 in [facility] Cardiac and Pulmonary Rehab  Date 11/26/21       Visit Diagnosis: Status post coronary artery stent placement  Patient's Home Medications on Admission:  Current Outpatient Medications:    aspirin 81 MG tablet, Take 81 mg by mouth every evening. , Disp: , Rfl:    azelastine (ASTELIN) 0.1 % nasal spray, Place into the nose., Disp: , Rfl:    CALCIUM POLYCARBOPHIL PO, Take 1 tablet by mouth daily., Disp: , Rfl:    clopidogrel (PLAVIX) 75 MG tablet, Take 75 mg by mouth daily., Disp: , Rfl:    clopidogrel (PLAVIX) 75 MG tablet, Take 1 tablet by mouth daily. (Patient not taking: Reported on 11/08/2021), Disp: , Rfl:    diphenhydrAMINE (BENADRYL) 25 MG tablet, Take 25 mg by mouth daily as needed for allergies., Disp: , Rfl:    DULoxetine (CYMBALTA) 60 MG capsule, Take 60 mg by mouth daily., Disp: , Rfl:    DULoxetine (CYMBALTA) 60 MG capsule, Take 1 capsule by mouth daily. (Patient not taking: Reported on 11/08/2021), Disp: , Rfl:    Empagliflozin (JARDIANCE PO), Take 20 mg by mouth daily., Disp: , Rfl:    EPINEPHrine 0.3 mg/0.3 mL IJ SOAJ injection, Inject into the muscle once., Disp: , Rfl:    gabapentin (NEURONTIN) 600 MG tablet, Take 1 tablet (600 mg total) by mouth every 8 (eight) hours., Disp: 90 tablet, Rfl: 2   gabapentin (NEURONTIN) 600 MG tablet, 2 (two) times daily (Patient not taking: Reported on 11/08/2021), Disp: , Rfl:    glimepiride (AMARYL) 2 MG tablet, Take 2 mg by mouth daily with breakfast., Disp: , Rfl:    glucose blood test strip, 1 each by Other route as needed for other. Use as instructed, Disp: , Rfl:    JARDIANCE 25 MG TABS  tablet, Take 25 mg by mouth daily., Disp: , Rfl:    losartan (COZAAR) 100 MG tablet, Take 100 mg by mouth daily., Disp: , Rfl:    losartan (COZAAR) 25 MG tablet, Take 25 mg by mouth every evening.  (Patient not taking: Reported on 11/08/2021), Disp: , Rfl:    metoprolol succinate (TOPROL-XL) 25 MG 24 hr tablet, Take 25 mg by mouth every evening. , Disp: , Rfl:    nitroGLYCERIN (NITROSTAT) 0.4 MG SL tablet, Place 0.4 mg under the tongue every 5 (five) minutes as needed for chest pain., Disp: , Rfl:    pantoprazole (PROTONIX) 40 MG tablet, Take 40 mg by mouth daily. , Disp: , Rfl:    pantoprazole (PROTONIX) 40 MG tablet, Take 1 tablet by mouth at bedtime. (Patient not taking: Reported on 11/08/2021), Disp: , Rfl:    polycarbophil (FIBERCON) 625 MG tablet, Take 625 mg by mouth every evening., Disp: , Rfl:    rosuvastatin (CRESTOR) 20 MG tablet, Take 20 mg by mouth every evening. , Disp: , Rfl:    spironolactone (ALDACTONE) 25 MG tablet, Take by mouth., Disp: , Rfl:    STUDY - ASPIRE - aspirin 81 mg or placebo tablet (PI-Sethi), Take 1 tablet by mouth every evening., Disp: , Rfl:    traZODone (DESYREL) 50 MG tablet, Take 50 mg by mouth at bedtime as  needed for sleep., Disp: , Rfl:   Past Medical History: Past Medical History:  Diagnosis Date   Allergic state    Anxiety    Asthma    Coronary artery disease    Depression    Diabetes mellitus without complication (HCC)    Diverticulosis    GERD (gastroesophageal reflux disease)    Hyperlipemia    Hypertension    Midsystolic murmur    Myocardial infarction ([place]) 1995 and 2008   Prostatitis    Proteinuria    Sleep apnea     Tobacco Use: Social History   Tobacco Use  Smoking Status Former   Packs/day: 1.00   Years: 25.00   Pack years: 25.00   Types: Cigarettes   Quit date: 09/23/1994   Years since quitting: 27.2  Smokeless Tobacco Never    Labs: Recent Review Flowsheet Data   There is no flowsheet data to display.       Exercise Target Goals: Exercise Program Goal: Individual exercise prescription set using results from initial 6 min walk test and THRR while considering  patients activity barriers and safety.   Exercise Prescription Goal: Initial exercise prescription builds to 30-45 minutes a day of aerobic activity, 2-3 days per week.  Home exercise guidelines will be given to patient during program as part of exercise prescription that the participant will acknowledge.   Education: Aerobic Exercise: - Group verbal and visual presentation on the components of exercise prescription. Introduces F.I.T.T principle from ACSM for exercise prescriptions.  Reviews F.I.T.T. principles of aerobic exercise including progression. Written material given at graduation. Flowsheet Row Cardiac Rehab from 11/26/2021 in [facility] Cardiac and Pulmonary Rehab  Education need identified 11/26/21       Education: Resistance Exercise: - Group verbal and visual presentation on the components of exercise prescription. Introduces F.I.T.T principle from ACSM for exercise prescriptions  Reviews F.I.T.T. principles of resistance exercise including progression. Written material given at graduation.    Education: Exercise & Equipment Safety: - Individual verbal instruction and demonstration of equipment use and safety with use of the equipment. Flowsheet Row Cardiac Rehab from 11/26/2021 in [facility] Cardiac and Pulmonary Rehab  Date 11/08/21  Educator [facility]  Instruction Review Code 1- Verbalizes Understanding       Education: Exercise Physiology & General Exercise Guidelines: - Group verbal and written instruction with models to review the exercise physiology of the cardiovascular system and associated critical values. Provides general exercise guidelines with specific guidelines to those with heart or lung disease.    Education: Flexibility, Balance, Mind/Body Relaxation: - Group verbal and visual presentation with interactive  activity on the components of exercise prescription. Introduces F.I.T.T principle from ACSM for exercise prescriptions. Reviews F.I.T.T. principles of flexibility and balance exercise training including progression. Also discusses the mind body connection.  Reviews various relaxation techniques to help reduce and manage stress (i.e. Deep breathing, progressive muscle relaxation, and visualization). Balance handout provided to take home. Written material given at graduation.   Activity Barriers & Risk Stratification:  Activity Barriers & Cardiac Risk Stratification - 11/26/21 1111       Activity Barriers & Cardiac Risk Stratification   Activity Barriers Muscular Weakness;Deconditioning;Shortness of Breath;Back Problems;Neck/Spine Problems;Balance Concerns   chronic back pain   Cardiac Risk Stratification High             6 Minute Walk:  6 Minute Walk     Row Name 11/26/21 1110         6 Minute Walk   Phase  Initial     Distance 1275 feet     Walk Time 6 minutes     # of Rest Breaks 0     MPH 2.41     METS 3     RPE 11     Perceived Dyspnea  1     VO2 Peak 10.53     Symptoms Yes (comment)     Comments SOB     Resting HR 66 bpm     Resting BP 108/64     Resting Oxygen Saturation  97 %     Exercise Oxygen Saturation  during 6 min walk 96 %     Max Ex. HR 128 bpm     Max Ex. BP 122/68     2 Minute Post BP 112/60              Oxygen Initial Assessment:   Oxygen Re-Evaluation:   Oxygen Discharge (Final Oxygen Re-Evaluation):   Initial Exercise Prescription:  Initial Exercise Prescription - 11/26/21 1100       Date of Initial Exercise RX and Referring Provider   Date 11/26/21    Referring Provider Cloretta Ned MD      Oxygen   Maintain Oxygen Saturation 88% or higher      Treadmill   MPH 2.2    Grade 0.5    Minutes 15    METs 2.84      Recumbant Elliptical   Level 1    RPM 50    Minutes 15    METs 2.5      REL-XR   Level 2    Speed 50     Minutes 15    METs 2.5      Prescription Details   Frequency (times per week) 2    Duration Progress to 30 minutes of continuous aerobic without signs/symptoms of physical distress      Intensity   THRR 40-80% of Max Heartrate 101-136    Ratings of Perceived Exertion 11-13    Perceived Dyspnea 0-4      Progression   Progression Continue to progress workloads to maintain intensity without signs/symptoms of physical distress.      Resistance Training   Training Prescription Yes    Weight 4 lb    Reps 10-15             Perform Capillary Blood Glucose checks as needed.  Exercise Prescription Changes:   Exercise Prescription Changes     Row Name 11/26/21 1100             Response to Exercise   Blood Pressure (Admit) 108/64       Blood Pressure (Exercise) 122/68       Blood Pressure (Exit) 112/60       Heart Rate (Admit) 66 bpm       Heart Rate (Exercise) 128 bpm       Heart Rate (Exit) 66 bpm       Oxygen Saturation (Admit) 97 %       Oxygen Saturation (Exercise) 96 %       Rating of Perceived Exertion (Exercise) 11       Perceived Dyspnea (Exercise) 1       Symptoms SOB       Comments walk test results                Exercise Comments:   Exercise Goals and Review:   Exercise Goals     Row Name 11/26/21  1114             Exercise Goals   Increase Physical Activity Yes       Intervention Provide advice, education, support and counseling about physical activity/exercise needs.;Develop an individualized exercise prescription for aerobic and resistive training based on initial evaluation findings, risk stratification, comorbidities and participant's personal goals.       Expected Outcomes Short Term: Attend rehab on a regular basis to increase amount of physical activity.;Long Term: Exercising regularly at least 3-5 days a week.;Long Term: Add in home exercise to make exercise part of routine and to increase amount of physical activity.       Increase  Strength and Stamina Yes       Intervention Provide advice, education, support and counseling about physical activity/exercise needs.;Develop an individualized exercise prescription for aerobic and resistive training based on initial evaluation findings, risk stratification, comorbidities and participant's personal goals.       Expected Outcomes Short Term: Increase workloads from initial exercise prescription for resistance, speed, and METs.;Short Term: Perform resistance training exercises routinely during rehab and add in resistance training at home;Long Term: Improve cardiorespiratory fitness, muscular endurance and strength as measured by increased METs and functional capacity (6MWT)       Able to understand and use rate of perceived exertion (RPE) scale Yes       Intervention Provide education and explanation on how to use RPE scale       Expected Outcomes Short Term: Able to use RPE daily in rehab to express subjective intensity level;Long Term:  Able to use RPE to guide intensity level when exercising independently       Able to understand and use Dyspnea scale Yes       Intervention Provide education and explanation on how to use Dyspnea scale       Expected Outcomes Short Term: Able to use Dyspnea scale daily in rehab to express subjective sense of shortness of breath during exertion;Long Term: Able to use Dyspnea scale to guide intensity level when exercising independently       Knowledge and understanding of Target Heart Rate Range (THRR) Yes       Intervention Provide education and explanation of THRR including how the numbers were predicted and where they are located for reference       Expected Outcomes Short Term: Able to state/look up THRR;Short Term: Able to use daily as guideline for intensity in rehab;Long Term: Able to use THRR to govern intensity when exercising independently       Able to check pulse independently Yes       Intervention Provide education and demonstration on how  to check pulse in carotid and radial arteries.;Review the importance of being able to check your own pulse for safety during independent exercise       Expected Outcomes Short Term: Able to explain why pulse checking is important during independent exercise;Long Term: Able to check pulse independently and accurately       Understanding of Exercise Prescription Yes       Intervention Provide education, explanation, and written materials on patient's individual exercise prescription       Expected Outcomes Short Term: Able to explain program exercise prescription;Long Term: Able to explain home exercise prescription to exercise independently                Exercise Goals Re-Evaluation :   Discharge Exercise Prescription (Final Exercise Prescription Changes):  Exercise Prescription Changes - 11/26/21 1100  Response to Exercise   Blood Pressure (Admit) 108/64    Blood Pressure (Exercise) 122/68    Blood Pressure (Exit) 112/60    Heart Rate (Admit) 66 bpm    Heart Rate (Exercise) 128 bpm    Heart Rate (Exit) 66 bpm    Oxygen Saturation (Admit) 97 %    Oxygen Saturation (Exercise) 96 %    Rating of Perceived Exertion (Exercise) 11    Perceived Dyspnea (Exercise) 1    Symptoms SOB    Comments walk test results             Nutrition:  Target Goals: Understanding of nutrition guidelines, daily intake of sodium 1500mg , cholesterol 200mg , calories 30% from fat and 7% or less from saturated fats, daily to have 5 or more servings of fruits and vegetables.  Education: All About Nutrition: -Group instruction provided by verbal, written material, interactive activities, discussions, models, and posters to present general guidelines for heart healthy nutrition including fat, fiber, MyPlate, the role of sodium in heart healthy nutrition, utilization of the nutrition label, and utilization of this knowledge for meal planning. Follow up email sent as well. Written material given at  graduation. Flowsheet Row Cardiac Rehab from 11/26/2021 in [facility] Cardiac and Pulmonary Rehab  Education need identified 11/26/21       Biometrics:  Pre Biometrics - 11/26/21 1114       Pre Biometrics   Height 5' 9.8" (1.773 m)    Weight 197 lb 6.4 oz (89.5 kg)    BMI (Calculated) 28.48    Single Leg Stand 1.6 seconds              Nutrition Therapy Plan and Nutrition Goals:  Nutrition Therapy & Goals - 11/26/21 1115       Intervention Plan   Intervention Prescribe, educate and counsel regarding individualized specific dietary modifications aiming towards targeted core components such as weight, hypertension, lipid management, diabetes, heart failure and other comorbidities.    Expected Outcomes Short Term Goal: Understand basic principles of dietary content, such as calories, fat, sodium, cholesterol and nutrients.;Short Term Goal: A plan has been developed with personal nutrition goals set during dietitian appointment.;Long Term Goal: Adherence to prescribed nutrition plan.             Nutrition Assessments:  MEDIFICTS Score Key: ?70 Need to make dietary changes  40-70 Heart Healthy Diet ? 40 Therapeutic Level Cholesterol Diet  Flowsheet Row Cardiac Rehab from 11/26/2021 in [facility] Cardiac and Pulmonary Rehab  Picture Your Plate Total Score on Admission 62      Picture Your Plate Scores: [id number] Unhealthy dietary pattern with much room for improvement. 41-50 Dietary pattern unlikely to meet recommendations for good health and room for improvement. 51-60 More healthful dietary pattern, with some room for improvement.  >60 Healthy dietary pattern, although there may be some specific behaviors that could be improved.    Nutrition Goals Re-Evaluation:   Nutrition Goals Discharge (Final Nutrition Goals Re-Evaluation):   Psychosocial: Target Goals: Acknowledge presence or absence of significant depression and/or stress, maximize coping skills, provide positive  support system. Participant is able to verbalize types and ability to use techniques and skills needed for reducing stress and depression.   Education: Stress, Anxiety, and Depression - Group verbal and visual presentation to define topics covered.  Reviews how body is impacted by stress, anxiety, and depression.  Also discusses healthy ways to reduce stress and to treat/manage anxiety and depression.  Written material given at graduation. Flowsheet  Row Cardiac Rehab from 11/26/2021 in [facility] Cardiac and Pulmonary Rehab  Education need identified 11/26/21       Education: Sleep Hygiene -Provides group verbal and written instruction about how sleep can affect your health.  Define sleep hygiene, discuss sleep cycles and impact of sleep habits. Review good sleep hygiene tips.    Initial Review & Psychosocial Screening:  Initial Psych Review & Screening - 11/08/21 0916       Initial Review   Current issues with Current Depression;History of Depression;Current Psychotropic Meds      Family Dynamics   Good Support System? Yes    Comments His health issues have made it hard for him and he takes medication to help cope with depression. He has a good family support system. He can look to his wife and his sister for support.      Barriers   Psychosocial barriers to participate in program The patient should benefit from training in stress management and relaxation.      Screening Interventions   Interventions Provide feedback about the scores to participant;Encouraged to exercise;To provide support and resources with identified psychosocial needs    Expected Outcomes Short Term goal: Utilizing psychosocial counselor, staff and physician to assist with identification of specific Stressors or current issues interfering with healing process. Setting desired goal for each stressor or current issue identified.;Long Term Goal: Stressors or current issues are controlled or eliminated.;Short Term goal:  Identification and review with participant of any Quality of Life or Depression concerns found by scoring the questionnaire.;Long Term goal: The participant improves quality of Life and PHQ9 Scores as seen by post scores and/or verbalization of changes             Quality of Life Scores:   Quality of Life - 11/26/21 1115       Quality of Life   Select Quality of Life      Quality of Life Scores   Health/Function Pre 22.4 %    Socioeconomic Pre 27.86 %    Psych/Spiritual Pre 28.29 %    Family Pre 30 %    GLOBAL Pre 25.73 %            Scores of 19 and below usually indicate a poorer quality of life in these areas.  A difference of  2-3 points is a clinically meaningful difference.  A difference of 2-3 points in the total score of the Quality of Life Index has been associated with significant improvement in overall quality of life, self-image, physical symptoms, and general health in studies assessing change in quality of life.  PHQ-9: Recent Review Flowsheet Data     Depression screen [facility] 2/9 11/26/2021 01/30/2021 10/03/2020   Decreased Interest 1 0 0   Down, Depressed, Hopeless 0 0 0   PHQ - 2 Score 1 0 0   Altered sleeping 1 - -   Tired, decreased energy 1 - -   Change in appetite 0 - -   Feeling bad or failure about yourself  0 - -   Trouble concentrating 0 - -   Moving slowly or fidgety/restless 0 - -   Suicidal thoughts 0 - -   PHQ-9 Score 3 - -   Difficult doing work/chores Somewhat difficult - -      Interpretation of Total Score  Total Score Depression Severity:  1-4 = Minimal depression, 5-9 = Mild depression, 10-14 = Moderate depression, 15-19 = Moderately severe depression, 20-27 = Severe depression   Psychosocial  Evaluation and Intervention:  Psychosocial Evaluation - 11/08/21 0917       Psychosocial Evaluation & Interventions   Interventions Encouraged to exercise with the program and follow exercise prescription;Relaxation education;Stress  management education    Comments His health issues have made it hard for him and he takes medication to help cope with depression. He has a good family support system. He can look to his wife and his sister for support.    Expected Outcomes Short: Start Heart[facility] to help with mood. Long: Maintain a healthy mental state    Continue Psychosocial Services  Follow up required by staff             Psychosocial Re-Evaluation:   Psychosocial Discharge (Final Psychosocial Re-Evaluation):   Vocational Rehabilitation: Provide vocational rehab assistance to qualifying candidates.   Vocational Rehab Evaluation & Intervention:   Education: Education Goals: Education classes will be provided on a variety of topics geared toward better understanding of heart health and risk factor modification. Participant will state understanding/return demonstration of topics presented as noted by education test scores.  Learning Barriers/Preferences:  Learning Barriers/Preferences - 11/08/21 0913       Learning Barriers/Preferences   Learning Barriers None    Learning Preferences None             General Cardiac Education Topics:  AED/CPR: - Group verbal and written instruction with the use of models to demonstrate the basic use of the AED with the basic ABC's of resuscitation.   Anatomy and Cardiac Procedures: - Group verbal and visual presentation and models provide information about basic cardiac anatomy and function. Reviews the testing methods done to diagnose heart disease and the outcomes of the test results. Describes the treatment choices: Medical Management, Angioplasty, or Coronary Bypass Surgery for treating various heart conditions including Myocardial Infarction, Angina, Valve Disease, and Cardiac Arrhythmias.  Written material given at graduation.   Medication Safety: - Group verbal and visual instruction to review commonly prescribed medications for heart and lung disease.  Reviews the medication, class of the drug, and side effects. Includes the steps to properly store meds and maintain the prescription regimen.  Written material given at graduation.   Intimacy: - Group verbal instruction through game format to discuss how heart and lung disease can affect sexual intimacy. Written material given at graduation.. Flowsheet Row Cardiac Rehab from 11/26/2021 in [facility] Cardiac and Pulmonary Rehab  Education need identified 11/26/21       Know Your Numbers and Heart Failure: - Group verbal and visual instruction to discuss disease risk factors for cardiac and pulmonary disease and treatment options.  Reviews associated critical values for Overweight/Obesity, Hypertension, Cholesterol, and Diabetes.  Discusses basics of heart failure: signs/symptoms and treatments.  Introduces Heart Failure Zone chart for action plan for heart failure.  Written material given at graduation. Flowsheet Row Cardiac Rehab from 11/26/2021 in [facility] Cardiac and Pulmonary Rehab  Education need identified 11/26/21       Infection Prevention: - Provides verbal and written material to individual with discussion of infection control including proper hand washing and proper equipment cleaning during exercise session. Flowsheet Row Cardiac Rehab from 11/26/2021 in [facility] Cardiac and Pulmonary Rehab  Date 11/08/21  Educator [facility]  Instruction Review Code 1- Verbalizes Understanding       Falls Prevention: - Provides verbal and written material to individual with discussion of falls prevention and safety. Flowsheet Row Cardiac Rehab from 11/26/2021 in [facility] Cardiac and Pulmonary Rehab  Date 11/08/21  Educator  [facility]  Instruction Review Code 1- Verbalizes Understanding       Other: -Provides group and verbal instruction on various topics (see comments)   Knowledge Questionnaire Score:  Knowledge Questionnaire Score - 11/26/21 1116       Knowledge Questionnaire Score   Pre Score 20/26              Core Components/Risk Factors/Patient Goals at Admission:  Personal Goals and Risk Factors at Admission - 11/26/21 1116       Core Components/Risk Factors/Patient Goals on Admission    Weight Management Yes;Weight Loss;Weight Maintenance    Intervention Weight Management: Develop a combined nutrition and exercise program designed to reach desired caloric intake, while maintaining appropriate intake of nutrient and fiber, sodium and fats, and appropriate energy expenditure required for the weight goal.;Weight Management: Provide education and appropriate resources to help participant work on and attain dietary goals.;Weight Management/Obesity: Establish reasonable short term and long term weight goals.    Admit Weight 197 lb 6.4 oz (89.5 kg)    Goal Weight: Short Term 192 lb (87.1 kg)    Goal Weight: Long Term 190 lb (86.2 kg)    Expected Outcomes Long Term: Adherence to nutrition and physical activity/exercise program aimed toward attainment of established weight goal;Short Term: Continue to assess and modify interventions until short term weight is achieved;Weight Loss: Understanding of general recommendations for a balanced deficit meal plan, which promotes 1-2 lb weight loss per week and includes a negative energy balance of [phone] kcal/d;Understanding recommendations for meals to include 15-35% energy as protein, 25-35% energy from fat, 35-60% energy from carbohydrates, less than 200mg  of dietary cholesterol, 20-35 gm of total fiber daily;Understanding of distribution of calorie intake throughout the day with the consumption of 4-5 meals/snacks    Diabetes Yes    Intervention Provide education about signs/symptoms and action to take for hypo/hyperglycemia.;Provide education about proper nutrition, including hydration, and aerobic/resistive exercise prescription along with prescribed medications to achieve blood glucose in normal ranges: Fasting glucose 65-99 mg/dL    Expected Outcomes  Short Term: Participant verbalizes understanding of the signs/symptoms and immediate care of hyper/hypoglycemia, proper foot care and importance of medication, aerobic/resistive exercise and nutrition plan for blood glucose control.;Long Term: Attainment of HbA1C < 7%.    Hypertension Yes    Intervention Provide education on lifestyle modifcations including regular physical activity/exercise, weight management, moderate sodium restriction and increased consumption of fresh fruit, vegetables, and low fat dairy, alcohol moderation, and smoking cessation.;Monitor prescription use compliance.    Expected Outcomes Short Term: Continued assessment and intervention until BP is < 140/38mm HG in hypertensive participants. < 130/70mm HG in hypertensive participants with diabetes, heart failure or chronic kidney disease.;Long Term: Maintenance of blood pressure at goal levels.    Lipids Yes    Intervention Provide education and support for participant on nutrition & aerobic/resistive exercise along with prescribed medications to achieve LDL 70mg , HDL >40mg .    Expected Outcomes Short Term: Participant states understanding of desired cholesterol values and is compliant with medications prescribed. Participant is following exercise prescription and nutrition guidelines.;Long Term: Cholesterol controlled with medications as prescribed, with individualized exercise RX and with personalized nutrition plan. Value goals: LDL < 70mg , HDL > 40 mg.             Education:Diabetes - Individual verbal and written instruction to review signs/symptoms of diabetes, desired ranges of glucose level fasting, after meals and with exercise. Acknowledge that pre and post exercise glucose checks will be  done for 3 sessions at entry of program. Flowsheet Row Cardiac Rehab from 11/26/2021 in [facility] Cardiac and Pulmonary Rehab  Date 11/08/21  Educator [facility]  Instruction Review Code 1- Verbalizes Understanding       Core  Components/Risk Factors/Patient Goals Review:    Core Components/Risk Factors/Patient Goals at Discharge (Final Review):    ITP Comments:  ITP Comments     Row Name 11/08/21 0912 11/26/21 1109 12/05/21 0827       ITP Comments Virtual Visit completed. Patient informed on EP and RD appointment and 6 Minute walk test. Patient also informed of patient health questionnaires on My Chart. Patient Verbalizes understanding. Visit diagnosis can be found in [facility] 10/01/2021. Completed and gym orientation. Initial ITP created and sent for review to Dr. Bethann Punches, Medical Director. 30 Day review completed. Medical Director ITP review done, changes made as directed, and signed approval by Medical Director.              Comments:

## 2021-12-13 ENCOUNTER — Other Ambulatory Visit: Payer: Self-pay

## 2021-12-13 ENCOUNTER — Encounter: Payer: Medicare Other | Attending: Cardiovascular Disease

## 2021-12-13 DIAGNOSIS — Z87891 Personal history of nicotine dependence: Secondary | ICD-10-CM | POA: Insufficient documentation

## 2021-12-13 DIAGNOSIS — Z955 Presence of coronary angioplasty implant and graft: Secondary | ICD-10-CM

## 2021-12-13 LAB — GLUCOSE, CAPILLARY
Glucose-Capillary: 163 mg/dL — ABNORMAL HIGH (ref 70–99)
Glucose-Capillary: 171 mg/dL — ABNORMAL HIGH (ref 70–99)

## 2021-12-13 NOTE — Progress Notes (Signed)
Daily Session Note  Patient Details  Name: Nicholas Knapp MRN: [id number] Date of Birth: January 11, 1956 Referring Provider:   Flowsheet Row Cardiac Rehab from 11/26/2021 in [facility] Cardiac and Pulmonary Rehab  Referring Provider Cloretta Ned MD       Encounter Date: 12/13/2021  Check In:  Session Check In - 12/13/21 0754       Check-In   Supervising physician immediately available to respond to emergencies See telemetry face sheet for immediately available ER MD    Location [facility]-Cardiac & Pulmonary Rehab    Staff Present Birdie Sons, MPA, RN;Amanda Oletta Darter, BA, ACSM CEP, Exercise Physiologist;Kara Eliezer Bottom, MS, ASCM CEP, Exercise Physiologist    Virtual Visit No    Medication changes reported     No    Fall or balance concerns reported    No    Warm-up and Cool-down Performed on first and last piece of equipment    Resistance Training Performed Yes    VAD Patient? No    PAD/SET Patient? No      Pain Assessment   Currently in Pain? No/denies                Social History   Tobacco Use  Smoking Status Former   Packs/day: 1.00   Years: 25.00   Pack years: 25.00   Types: Cigarettes   Quit date: 09/23/1994   Years since quitting: 27.2  Smokeless Tobacco Never    Goals Met:  Independence with exercise equipment Exercise tolerated well No report of concerns or symptoms today Strength training completed today  Goals Unmet:  Not Applicable  Comments: First full day of exercise!  Patient was oriented to gym and equipment including functions, settings, policies, and procedures.  Patient's individual exercise prescription and treatment plan were reviewed.  All starting workloads were established based on the results of the 6 minute walk test done at initial orientation visit.  The plan for exercise progression was also introduced and progression will be customized based on patient's performance and goals.    Dr. Emily Filbert is Medical Director for [place].  Dr. Ottie Glazier is Medical Director for [facility] Pulmonary Rehabilitation.

## 2021-12-25 ENCOUNTER — Encounter: Payer: Self-pay | Admitting: *Deleted

## 2021-12-25 ENCOUNTER — Telehealth: Payer: Self-pay | Admitting: *Deleted

## 2021-12-25 ENCOUNTER — Encounter: Payer: Medicare Other | Admitting: Student in an Organized Health Care Education/Training Program

## 2021-12-25 DIAGNOSIS — Z955 Presence of coronary angioplasty implant and graft: Secondary | ICD-10-CM

## 2021-12-25 NOTE — Telephone Encounter (Signed)
Nicholas Knapp has been out since 12/13/21.  He came by today. He let us know that he tested positive for COVID last Wednesday on 12/19/21.  We requested that he stay out 10 days from positive test date.  He will return next week.

## 2021-12-31 ENCOUNTER — Other Ambulatory Visit: Payer: Self-pay

## 2021-12-31 DIAGNOSIS — Z955 Presence of coronary angioplasty implant and graft: Secondary | ICD-10-CM

## 2021-12-31 NOTE — Progress Notes (Signed)
Completed initial RD consultation ?

## 2022-01-01 ENCOUNTER — Other Ambulatory Visit: Payer: Self-pay

## 2022-01-01 DIAGNOSIS — Z955 Presence of coronary angioplasty implant and graft: Secondary | ICD-10-CM | POA: Diagnosis not present

## 2022-01-01 LAB — GLUCOSE, CAPILLARY
Glucose-Capillary: 235 mg/dL — ABNORMAL HIGH (ref 70–99)
Glucose-Capillary: 132 mg/dL — ABNORMAL HIGH (ref 70–99)

## 2022-01-01 NOTE — Progress Notes (Signed)
Daily Session Note  Patient Details  Name: Nicholas Knapp MRN: [id number] Date of Birth: February 16, 1956 Referring Provider:   Flowsheet Row Cardiac Rehab from 11/26/2021 in [facility] Cardiac and Pulmonary Rehab  Referring Provider Cloretta Ned MD       Encounter Date: 01/01/2022  Check In:  Session Check In - 01/01/22 0937       Check-In   Supervising physician immediately available to respond to emergencies See telemetry face sheet for immediately available ER MD    Location [facility]-Cardiac & Pulmonary Rehab    Staff Present Birdie Sons, MPA, RN;Jessica Luan Pulling, MA, RCEP, CCRP, CCET;Melissa [place], RDN, LDN    Virtual Visit No    Medication changes reported     No    Fall or balance concerns reported    No    Warm-up and Cool-down Performed on first and last piece of equipment    Resistance Training Performed Yes    VAD Patient? No    PAD/SET Patient? No      Pain Assessment   Currently in Pain? No/denies                Social History   Tobacco Use  Smoking Status Former   Packs/day: 1.00   Years: 25.00   Pack years: 25.00   Types: Cigarettes   Quit date: 09/23/1994   Years since quitting: 27.2  Smokeless Tobacco Never    Goals Met:  Independence with exercise equipment Exercise tolerated well No report of concerns or symptoms today Strength training completed today  Goals Unmet:  Not Applicable  Comments: Pt able to follow exercise prescription today without complaint.  Will continue to monitor for progression.    Dr. Emily Filbert is Medical Director for [place].  Dr. Ottie Glazier is Medical Director for [facility] Pulmonary Rehabilitation.

## 2022-01-02 ENCOUNTER — Encounter: Payer: Self-pay | Admitting: *Deleted

## 2022-01-02 DIAGNOSIS — Z955 Presence of coronary angioplasty implant and graft: Secondary | ICD-10-CM

## 2022-01-02 NOTE — Progress Notes (Signed)
Cardiac Individual Treatment Plan  Patient Details  Name: Nicholas Knapp MRN: [id number] Date of Birth: 10-Feb-1956 Referring Provider:   Flowsheet Row Cardiac Rehab from 11/26/2021 in [facility] Cardiac and Pulmonary Rehab  Referring Provider Cloretta Ned MD       Initial Encounter Date:  Flowsheet Row Cardiac Rehab from 11/26/2021 in [facility] Cardiac and Pulmonary Rehab  Date 11/26/21       Visit Diagnosis: Status post coronary artery stent placement  Patient's Home Medications on Admission:  Current Outpatient Medications:    aspirin 81 MG tablet, Take 81 mg by mouth every evening. , Disp: , Rfl:    azelastine (ASTELIN) 0.1 % nasal spray, Place into the nose., Disp: , Rfl:    CALCIUM POLYCARBOPHIL PO, Take 1 tablet by mouth daily., Disp: , Rfl:    clopidogrel (PLAVIX) 75 MG tablet, Take 75 mg by mouth daily., Disp: , Rfl:    clopidogrel (PLAVIX) 75 MG tablet, Take 1 tablet by mouth daily. (Patient not taking: Reported on 11/08/2021), Disp: , Rfl:    diphenhydrAMINE (BENADRYL) 25 MG tablet, Take 25 mg by mouth daily as needed for allergies., Disp: , Rfl:    DULoxetine (CYMBALTA) 60 MG capsule, Take 60 mg by mouth daily., Disp: , Rfl:    DULoxetine (CYMBALTA) 60 MG capsule, Take 1 capsule by mouth daily. (Patient not taking: Reported on 11/08/2021), Disp: , Rfl:    Empagliflozin (JARDIANCE PO), Take 20 mg by mouth daily., Disp: , Rfl:    EPINEPHrine 0.3 mg/0.3 mL IJ SOAJ injection, Inject into the muscle once., Disp: , Rfl:    gabapentin (NEURONTIN) 600 MG tablet, Take 1 tablet (600 mg total) by mouth every 8 (eight) hours., Disp: 90 tablet, Rfl: 2   gabapentin (NEURONTIN) 600 MG tablet, 2 (two) times daily (Patient not taking: Reported on 11/08/2021), Disp: , Rfl:    glimepiride (AMARYL) 2 MG tablet, Take 2 mg by mouth daily with breakfast., Disp: , Rfl:    glucose blood test strip, 1 each by Other route as needed for other. Use as instructed, Disp: , Rfl:    JARDIANCE 25 MG TABS  tablet, Take 25 mg by mouth daily., Disp: , Rfl:    losartan (COZAAR) 100 MG tablet, Take 100 mg by mouth daily., Disp: , Rfl:    losartan (COZAAR) 25 MG tablet, Take 25 mg by mouth every evening.  (Patient not taking: Reported on 11/08/2021), Disp: , Rfl:    metoprolol succinate (TOPROL-XL) 25 MG 24 hr tablet, Take 25 mg by mouth every evening. , Disp: , Rfl:    nitroGLYCERIN (NITROSTAT) 0.4 MG SL tablet, Place 0.4 mg under the tongue every 5 (five) minutes as needed for chest pain., Disp: , Rfl:    pantoprazole (PROTONIX) 40 MG tablet, Take 40 mg by mouth daily. , Disp: , Rfl:    pantoprazole (PROTONIX) 40 MG tablet, Take 1 tablet by mouth at bedtime. (Patient not taking: Reported on 11/08/2021), Disp: , Rfl:    polycarbophil (FIBERCON) 625 MG tablet, Take 625 mg by mouth every evening., Disp: , Rfl:    rosuvastatin (CRESTOR) 20 MG tablet, Take 20 mg by mouth every evening. , Disp: , Rfl:    spironolactone (ALDACTONE) 25 MG tablet, Take by mouth., Disp: , Rfl:    STUDY - ASPIRE - aspirin 81 mg or placebo tablet (PI-Sethi), Take 1 tablet by mouth every evening., Disp: , Rfl:    traZODone (DESYREL) 50 MG tablet, Take 50 mg by mouth at bedtime as  needed for sleep., Disp: , Rfl:   Past Medical History: Past Medical History:  Diagnosis Date   Allergic state    Anxiety    Asthma    Coronary artery disease    Depression    Diabetes mellitus without complication (HCC)    Diverticulosis    GERD (gastroesophageal reflux disease)    Hyperlipemia    Hypertension    Midsystolic murmur    Myocardial infarction ([place]) 1995 and 2008   Prostatitis    Proteinuria    Sleep apnea     Tobacco Use: Social History   Tobacco Use  Smoking Status Former   Packs/day: 1.00   Years: 25.00   Pack years: 25.00   Types: Cigarettes   Quit date: 09/23/1994   Years since quitting: 27.2  Smokeless Tobacco Never    Labs: Recent Review Flowsheet Data   There is no flowsheet data to display.       Exercise Target Goals: Exercise Program Goal: Individual exercise prescription set using results from initial 6 min walk test and THRR while considering  patients activity barriers and safety.   Exercise Prescription Goal: Initial exercise prescription builds to 30-45 minutes a day of aerobic activity, 2-3 days per week.  Home exercise guidelines will be given to patient during program as part of exercise prescription that the participant will acknowledge.   Education: Aerobic Exercise: - Group verbal and visual presentation on the components of exercise prescription. Introduces F.I.T.T principle from ACSM for exercise prescriptions.  Reviews F.I.T.T. principles of aerobic exercise including progression. Written material given at graduation. Flowsheet Row Cardiac Rehab from 12/13/2021 in [facility] Cardiac and Pulmonary Rehab  Education need identified 11/26/21       Education: Resistance Exercise: - Group verbal and visual presentation on the components of exercise prescription. Introduces F.I.T.T principle from ACSM for exercise prescriptions  Reviews F.I.T.T. principles of resistance exercise including progression. Written material given at graduation. Flowsheet Row Cardiac Rehab from 12/13/2021 in [facility] Cardiac and Pulmonary Rehab  Date 12/13/21  Educator [facility]  Instruction Review Code 1- Verbalizes Understanding        Education: Exercise & Equipment Safety: - Individual verbal instruction and demonstration of equipment use and safety with use of the equipment. Flowsheet Row Cardiac Rehab from 12/13/2021 in [facility] Cardiac and Pulmonary Rehab  Date 11/08/21  Educator [facility]  Instruction Review Code 1- Verbalizes Understanding       Education: Exercise Physiology & General Exercise Guidelines: - Group verbal and written instruction with models to review the exercise physiology of the cardiovascular system and associated critical values. Provides general exercise guidelines with specific  guidelines to those with heart or lung disease.    Education: Flexibility, Balance, Mind/Body Relaxation: - Group verbal and visual presentation with interactive activity on the components of exercise prescription. Introduces F.I.T.T principle from ACSM for exercise prescriptions. Reviews F.I.T.T. principles of flexibility and balance exercise training including progression. Also discusses the mind body connection.  Reviews various relaxation techniques to help reduce and manage stress (i.e. Deep breathing, progressive muscle relaxation, and visualization). Balance handout provided to take home. Written material given at graduation.   Activity Barriers & Risk Stratification:  Activity Barriers & Cardiac Risk Stratification - 11/26/21 1111       Activity Barriers & Cardiac Risk Stratification   Activity Barriers Muscular Weakness;Deconditioning;Shortness of Breath;Back Problems;Neck/Spine Problems;Balance Concerns   chronic back pain   Cardiac Risk Stratification High  6 Minute Walk:  6 Minute Walk     Row Name 11/26/21 1110         6 Minute Walk   Phase Initial     Distance 1275 feet     Walk Time 6 minutes     # of Rest Breaks 0     MPH 2.41     METS 3     RPE 11     Perceived Dyspnea  1     VO2 Peak 10.53     Symptoms Yes (comment)     Comments SOB     Resting HR 66 bpm     Resting BP 108/64     Resting Oxygen Saturation  97 %     Exercise Oxygen Saturation  during 6 min walk 96 %     Max Ex. HR 128 bpm     Max Ex. BP 122/68     2 Minute Post BP 112/60              Oxygen Initial Assessment:   Oxygen Re-Evaluation:   Oxygen Discharge (Final Oxygen Re-Evaluation):   Initial Exercise Prescription:  Initial Exercise Prescription - 11/26/21 1100       Date of Initial Exercise RX and Referring Provider   Date 11/26/21    Referring Provider Cloretta Ned MD      Oxygen   Maintain Oxygen Saturation 88% or higher      Treadmill   MPH  2.2    Grade 0.5    Minutes 15    METs 2.84      Recumbant Elliptical   Level 1    RPM 50    Minutes 15    METs 2.5      REL-XR   Level 2    Speed 50    Minutes 15    METs 2.5      Prescription Details   Frequency (times per week) 2    Duration Progress to 30 minutes of continuous aerobic without signs/symptoms of physical distress      Intensity   THRR 40-80% of Max Heartrate 101-136    Ratings of Perceived Exertion 11-13    Perceived Dyspnea 0-4      Progression   Progression Continue to progress workloads to maintain intensity without signs/symptoms of physical distress.      Resistance Training   Training Prescription Yes    Weight 4 lb    Reps 10-15             Perform Capillary Blood Glucose checks as needed.  Exercise Prescription Changes:   Exercise Prescription Changes     Row Name 11/26/21 1100 12/17/21 0900           Response to Exercise   Blood Pressure (Admit) 108/64 102/50      Blood Pressure (Exercise) 122/68 126/60      Blood Pressure (Exit) 112/60 110/62      Heart Rate (Admit) 66 bpm 64 bpm      Heart Rate (Exercise) 128 bpm 100 bpm      Heart Rate (Exit) 66 bpm 85 bpm      Oxygen Saturation (Admit) 97 % --      Oxygen Saturation (Exercise) 96 % --      Rating of Perceived Exertion (Exercise) 11 12      Perceived Dyspnea (Exercise) 1 --      Symptoms SOB none      Comments walk test results 1st full  day of exercise      Duration -- Progress to 30 minutes of  aerobic without signs/symptoms of physical distress      Intensity -- THRR unchanged        Progression   Progression -- Continue to progress workloads to maintain intensity without signs/symptoms of physical distress.      Average METs -- 2.62        Resistance Training   Training Prescription -- Yes      Weight -- 4 lb      Reps -- 10-15        Interval Training   Interval Training -- No        Treadmill   MPH -- 2.2      Grade -- 0.5      Minutes -- 15       METs -- 2.84        REL-XR   Level -- 2      Minutes -- 15      METs -- 2.4        Oxygen   Maintain Oxygen Saturation -- 88% or higher               Exercise Comments:   Exercise Comments     Row Name 12/13/21 0755           Exercise Comments First full day of exercise!  Patient was oriented to gym and equipment including functions, settings, policies, and procedures.  Patient's individual exercise prescription and treatment plan were reviewed.  All starting workloads were established based on the results of the 6 minute walk test done at initial orientation visit.  The plan for exercise progression was also introduced and progression will be customized based on patient's performance and goals.                Exercise Goals and Review:   Exercise Goals     Row Name 11/26/21 1114             Exercise Goals   Increase Physical Activity Yes       Intervention Provide advice, education, support and counseling about physical activity/exercise needs.;Develop an individualized exercise prescription for aerobic and resistive training based on initial evaluation findings, risk stratification, comorbidities and participant's personal goals.       Expected Outcomes Short Term: Attend rehab on a regular basis to increase amount of physical activity.;Long Term: Exercising regularly at least 3-5 days a week.;Long Term: Add in home exercise to make exercise part of routine and to increase amount of physical activity.       Increase Strength and Stamina Yes       Intervention Provide advice, education, support and counseling about physical activity/exercise needs.;Develop an individualized exercise prescription for aerobic and resistive training based on initial evaluation findings, risk stratification, comorbidities and participant's personal goals.       Expected Outcomes Short Term: Increase workloads from initial exercise prescription for resistance, speed, and METs.;Short Term:  Perform resistance training exercises routinely during rehab and add in resistance training at home;Long Term: Improve cardiorespiratory fitness, muscular endurance and strength as measured by increased METs and functional capacity (6MWT)       Able to understand and use rate of perceived exertion (RPE) scale Yes       Intervention Provide education and explanation on how to use RPE scale       Expected Outcomes Short Term: Able to use RPE daily in rehab to express  subjective intensity level;Long Term:  Able to use RPE to guide intensity level when exercising independently       Able to understand and use Dyspnea scale Yes       Intervention Provide education and explanation on how to use Dyspnea scale       Expected Outcomes Short Term: Able to use Dyspnea scale daily in rehab to express subjective sense of shortness of breath during exertion;Long Term: Able to use Dyspnea scale to guide intensity level when exercising independently       Knowledge and understanding of Target Heart Rate Range (THRR) Yes       Intervention Provide education and explanation of THRR including how the numbers were predicted and where they are located for reference       Expected Outcomes Short Term: Able to state/look up THRR;Short Term: Able to use daily as guideline for intensity in rehab;Long Term: Able to use THRR to govern intensity when exercising independently       Able to check pulse independently Yes       Intervention Provide education and demonstration on how to check pulse in carotid and radial arteries.;Review the importance of being able to check your own pulse for safety during independent exercise       Expected Outcomes Short Term: Able to explain why pulse checking is important during independent exercise;Long Term: Able to check pulse independently and accurately       Understanding of Exercise Prescription Yes       Intervention Provide education, explanation, and written materials on patient's  individual exercise prescription       Expected Outcomes Short Term: Able to explain program exercise prescription;Long Term: Able to explain home exercise prescription to exercise independently                Exercise Goals Re-Evaluation :  Exercise Goals Re-Evaluation     Row Name 12/13/21 0755 12/17/21 0921 12/31/21 1258         Exercise Goal Re-Evaluation   Exercise Goals Review Increase Physical Activity;Able to understand and use rate of perceived exertion (RPE) scale;Knowledge and understanding of Target Heart Rate Range (THRR);Understanding of Exercise Prescription;Increase Strength and Stamina;Able to understand and use Dyspnea scale;Able to check pulse independently Increase Physical Activity;Increase Strength and Stamina --     Comments Reviewed RPE and dyspnea scales, THR and program prescription with pt today.  Pt voiced understanding and was given a copy of goals to take home. Khamari did well for his first full session of exercise. He tolerated his initial exercise prescription loads and maintained a RPE of 12. Will continue to monitor as he progresses throughout the program. Out since 12/13/21 with COVID.  Scheduled to return this week.     Expected Outcomes Short: Use RPE daily to regulate intensity. Long: Follow program prescription in THR. Short: Continue current exercise prescription Long: Increase overall strength and MET level --              Discharge Exercise Prescription (Final Exercise Prescription Changes):  Exercise Prescription Changes - 12/17/21 0900       Response to Exercise   Blood Pressure (Admit) 102/50    Blood Pressure (Exercise) 126/60    Blood Pressure (Exit) 110/62    Heart Rate (Admit) 64 bpm    Heart Rate (Exercise) 100 bpm    Heart Rate (Exit) 85 bpm    Rating of Perceived Exertion (Exercise) 12    Symptoms none    Comments  1st full day of exercise    Duration Progress to 30 minutes of  aerobic without signs/symptoms of physical  distress    Intensity THRR unchanged      Progression   Progression Continue to progress workloads to maintain intensity without signs/symptoms of physical distress.    Average METs 2.62      Resistance Training   Training Prescription Yes    Weight 4 lb    Reps 10-15      Interval Training   Interval Training No      Treadmill   MPH 2.2    Grade 0.5    Minutes 15    METs 2.84      REL-XR   Level 2    Minutes 15    METs 2.4      Oxygen   Maintain Oxygen Saturation 88% or higher             Nutrition:  Target Goals: Understanding of nutrition guidelines, daily intake of sodium <1510m, cholesterol <2042m calories 30% from fat and 7% or less from saturated fats, daily to have 5 or more servings of fruits and vegetables.  Education: All About Nutrition: -Group instruction provided by verbal, written material, interactive activities, discussions, models, and posters to present general guidelines for heart healthy nutrition including fat, fiber, MyPlate, the role of sodium in heart healthy nutrition, utilization of the nutrition label, and utilization of this knowledge for meal planning. Follow up email sent as well. Written material given at graduation. Flowsheet [facility]  Education need identified 11/26/21       Biometrics:  Pre Biometrics - 11/26/21 1114       Pre Biometrics   Height 5' 9.8" (1.773 m)    Weight 197 lb 6.4 oz (89.5 kg)    BMI (Calculated) 28.48    Single Leg Stand 1.6 seconds              Nutrition Therapy Plan and Nutrition Goals:  Nutrition Therapy & Goals - 12/31/21 1001       Nutrition Therapy   Diet Heart healthy, low Na, T2DM friendly    Drug/Food Interactions Statins/Certain Fruits    Protein (specify units) 70g    Fiber 30 grams    Whole Grain Foods 3 servings    Saturated Fats 12 max. grams    Fruits and Vegetables 8 servings/day    Sodium 2 grams       Personal Nutrition Goals   Nutrition Goal ST: consider trying daves killer bread for his whole grains, adding healthy fats with fruit like peanut butter with grapes LT: maintain A1C <7, limit Na <2g/day, make at least 1/2 grains whole grains    Comments 89.o. M admitted to Cardiac/Pulmonary rehab s/p stent placement. PMHx includes. PSHx includes CAD, T2DM (A1C 12/19/21: 7.1), dyspnea on exertion, dyspepsia, HTN. Relevant medications include fibercon, cymbalta, jardiance, glimepiride, protonix, crestor, trazadone. Subjective: He reports lowering sugar intake. B: egg sandwich (white bread) L: burger or something out, sandwich at home like pimento cheese D: lean meats and salads a couple of times per week. He reports eating fruit more at snacks and meals - he doesn't snack often. Drinks: water, coke occasionally. He will cook with olive oil, goes out to eat <1x/week, uses some salt after cooking. [id number] Score: 62. Vegetables & Fruits 9/12. Breads, Grains & Cereals 6/12. Red &  Processed Meat 10/12. Poultry 0/2. Fish & Shellfish 1/4. Beans, Nuts & Seeds 0/4. Milk & Dairy Foods 4/6. Toppings, Oils, Seasonings & Salt 13/20. Sweets, Snacks & Restaurant Food 12/14. Beverages 7/10. Discussed diabetes and heart healthy nutrition. Suggested switching to whole wheat bread and adding in heart healthy fats like nuts and seeds with his fruit.      Intervention Plan   Intervention Prescribe, educate and counsel regarding individualized specific dietary modifications aiming towards targeted core components such as weight, hypertension, lipid management, diabetes, heart failure and other comorbidities.    Expected Outcomes Short Term Goal: Understand basic principles of dietary content, such as calories, fat, sodium, cholesterol and nutrients.;Short Term Goal: A plan has been developed with personal nutrition goals set during dietitian appointment.;Long Term Goal:  Adherence to prescribed nutrition plan.             Nutrition Assessments:  MEDIFICTS Score Key: ?70 Need to make dietary changes  40-70 Heart Healthy Diet ? 40 Therapeutic Level Cholesterol Diet  Flowsheet Row Cardiac Rehab from 11/26/2021 in [facility] Cardiac and Pulmonary Rehab  Picture Your Plate Total Score on Admission 62      Picture Your Plate Scores: <47 Unhealthy dietary pattern with much room for improvement. 41-50 Dietary pattern unlikely to meet recommendations for good health and room for improvement. 51-60 More healthful dietary pattern, with some room for improvement.  >60 Healthy dietary pattern, although there may be some specific behaviors that could be improved.    Nutrition Goals Re-Evaluation:   Nutrition Goals Discharge (Final Nutrition Goals Re-Evaluation):   Psychosocial: Target Goals: Acknowledge presence or absence of significant depression and/or stress, maximize coping skills, provide positive support system. Participant is able to verbalize types and ability to use techniques and skills needed for reducing stress and depression.   Education: Stress, Anxiety, and Depression - Group verbal and visual presentation to define topics covered.  Reviews how body is impacted by stress, anxiety, and depression.  Also discusses healthy ways to reduce stress and to treat/manage anxiety and depression.  Written material given at graduation. Flowsheet Row Cardiac Rehab from 12/13/2021 in [facility] Cardiac and Pulmonary Rehab  Education need identified 11/26/21       Education: Sleep Hygiene -Provides group verbal and written instruction about how sleep can affect your health.  Define sleep hygiene, discuss sleep cycles and impact of sleep habits. Review good sleep hygiene tips.    Initial Review & Psychosocial Screening:  Initial Psych Review & Screening - 11/08/21 0916       Initial Review   Current issues with Current Depression;History of Depression;Current  Psychotropic Meds      Family Dynamics   Good Support System? Yes    Comments His health issues have made it hard for him and he takes medication to help cope with depression. He has a good family support system. He can look to his wife and his sister for support.      Barriers   Psychosocial barriers to participate in program The patient should benefit from training in stress management and relaxation.      Screening Interventions   Interventions Provide feedback about the scores to participant;Encouraged to exercise;To provide support and resources with identified psychosocial needs    Expected Outcomes Short Term goal: Utilizing psychosocial counselor, staff and physician to assist with identification of specific Stressors or current issues interfering with healing process. Setting desired goal for each stressor or current issue identified.;Long Term Goal: Stressors or current issues are  controlled or eliminated.;Short Term goal: Identification and review with participant of any Quality of Life or Depression concerns found by scoring the questionnaire.;Long Term goal: The participant improves quality of Life and PHQ9 Scores as seen by post scores and/or verbalization of changes             Quality of Life Scores:   Quality of Life - 11/26/21 1115       Quality of Life   Select Quality of Life      Quality of Life Scores   Health/Function Pre 22.4 %    Socioeconomic Pre 27.86 %    Psych/Spiritual Pre 28.29 %    Family Pre 30 %    GLOBAL Pre 25.73 %            Scores of 19 and below usually indicate a poorer quality of life in these areas.  A difference of  2-3 points is a clinically meaningful difference.  A difference of 2-3 points in the total score of the Quality of Life Index has been associated with significant improvement in overall quality of life, self-image, physical symptoms, and general health in studies assessing change in quality of life.  PHQ-9: Recent Review  Flowsheet Data     Depression screen [facility] 2/9 11/26/2021 01/30/2021 10/03/2020   Decreased Interest 1 0 0   Down, Depressed, Hopeless 0 0 0   PHQ - 2 Score 1 0 0   Altered sleeping 1 - -   Tired, decreased energy 1 - -   Change in appetite 0 - -   Feeling bad or failure about yourself  0 - -   Trouble concentrating 0 - -   Moving slowly or fidgety/restless 0 - -   Suicidal thoughts 0 - -   PHQ-9 Score 3 - -   Difficult doing work/chores Somewhat difficult - -      Interpretation of Total Score  Total Score Depression Severity:  1-4 = Minimal depression, 5-9 = Mild depression, 10-14 = Moderate depression, 15-19 = Moderately severe depression, 20-27 = Severe depression   Psychosocial Evaluation and Intervention:  Psychosocial Evaluation - 11/08/21 0917       Psychosocial Evaluation & Interventions   Interventions Encouraged to exercise with the program and follow exercise prescription;Relaxation education;Stress management education    Comments His health issues have made it hard for him and he takes medication to help cope with depression. He has a good family support system. He can look to his wife and his sister for support.    Expected Outcomes Short: Start [facility] to help with mood. Long: Maintain a healthy mental state    Continue Psychosocial Services  Follow up required by staff             Psychosocial Re-Evaluation:   Psychosocial Discharge (Final Psychosocial Re-Evaluation):   Vocational Rehabilitation: Provide vocational rehab assistance to qualifying candidates.   Vocational Rehab Evaluation & Intervention:   Education: Education Goals: Education classes will be provided on a variety of topics geared toward better understanding of heart health and risk factor modification. Participant will state understanding/return demonstration of topics presented as noted by education test scores.  Learning Barriers/Preferences:  Learning Barriers/Preferences -  11/08/21 0913       Learning Barriers/Preferences   Learning Barriers None    Learning Preferences None             General Cardiac Education Topics:  AED/CPR: - Group verbal and written instruction with the use of  models to demonstrate the basic use of the AED with the basic ABC's of resuscitation.   Anatomy and Cardiac Procedures: - Group verbal and visual presentation and models provide information about basic cardiac anatomy and function. Reviews the testing methods done to diagnose heart disease and the outcomes of the test results. Describes the treatment choices: Medical Management, Angioplasty, or Coronary Bypass Surgery for treating various heart conditions including Myocardial Infarction, Angina, Valve Disease, and Cardiac Arrhythmias.  Written material given at graduation. Flowsheet Row Cardiac Rehab from 12/13/2021 in [facility] Cardiac and Pulmonary Rehab  Date 12/13/21  Educator SB  Instruction Review Code 1- Verbalizes Understanding       Medication Safety: - Group verbal and visual instruction to review commonly prescribed medications for heart and lung disease. Reviews the medication, class of the drug, and side effects. Includes the steps to properly store meds and maintain the prescription regimen.  Written material given at graduation.   Intimacy: - Group verbal instruction through game format to discuss how heart and lung disease can affect sexual intimacy. Written material given at graduation.. Flowsheet Row Cardiac Rehab from 12/13/2021 in [facility] Cardiac and Pulmonary Rehab  Education need identified 11/26/21       Know Your Numbers and Heart Failure: - Group verbal and visual instruction to discuss disease risk factors for cardiac and pulmonary disease and treatment options.  Reviews associated critical values for Overweight/Obesity, Hypertension, Cholesterol, and Diabetes.  Discusses basics of heart failure: signs/symptoms and treatments.  Introduces Heart  Failure Zone chart for action plan for heart failure.  Written material given at graduation. Flowsheet Row Cardiac Rehab from 12/13/2021 in [facility] Cardiac and Pulmonary Rehab  Education need identified 11/26/21       Infection Prevention: - Provides verbal and written material to individual with discussion of infection control including proper hand washing and proper equipment cleaning during exercise session. Flowsheet Row Cardiac Rehab from 12/13/2021 in [facility] Cardiac and Pulmonary Rehab  Date 11/08/21  Educator [facility]  Instruction Review Code 1- Verbalizes Understanding       Falls Prevention: - Provides verbal and written material to individual with discussion of falls prevention and safety. Flowsheet Row Cardiac Rehab from 12/13/2021 in [facility] Cardiac and Pulmonary Rehab  Date 11/08/21  Educator [facility]  Instruction Review Code 1- Verbalizes Understanding       Other: -Provides group and verbal instruction on various topics (see comments)   Knowledge Questionnaire Score:  Knowledge Questionnaire Score - 11/26/21 1116       Knowledge Questionnaire Score   Pre Score 20/26             Core Components/Risk Factors/Patient Goals at Admission:  Personal Goals and Risk Factors at Admission - 11/26/21 1116       Core Components/Risk Factors/Patient Goals on Admission    Weight Management Yes;Weight Loss;Weight Maintenance    Intervention Weight Management: Develop a combined nutrition and exercise program designed to reach desired caloric intake, while maintaining appropriate intake of nutrient and fiber, sodium and fats, and appropriate energy expenditure required for the weight goal.;Weight Management: Provide education and appropriate resources to help participant work on and attain dietary goals.;Weight Management/Obesity: Establish reasonable short term and long term weight goals.    Admit Weight 197 lb 6.4 oz (89.5 kg)    Goal Weight: Short Term 192 lb (87.1 kg)    Goal Weight:  Long Term 190 lb (86.2 kg)    Expected Outcomes Long Term: Adherence to nutrition and physical activity/exercise program  aimed toward attainment of established weight goal;Short Term: Continue to assess and modify interventions until short term weight is achieved;Weight Loss: Understanding of general recommendations for a balanced deficit meal plan, which promotes 1-2 lb weight loss per week and includes a negative energy balance of [phone] kcal/d;Understanding recommendations for meals to include 15-35% energy as protein, 25-35% energy from fat, 35-60% energy from carbohydrates, less than 253m of dietary cholesterol, 20-35 gm of total fiber daily;Understanding of distribution of calorie intake throughout the day with the consumption of 4-5 meals/snacks    Diabetes Yes    Intervention Provide education about signs/symptoms and action to take for hypo/hyperglycemia.;Provide education about proper nutrition, including hydration, and aerobic/resistive exercise prescription along with prescribed medications to achieve blood glucose in normal ranges: Fasting glucose 65-99 mg/dL    Expected Outcomes Short Term: Participant verbalizes understanding of the signs/symptoms and immediate care of hyper/hypoglycemia, proper foot care and importance of medication, aerobic/resistive exercise and nutrition plan for blood glucose control.;Long Term: Attainment of HbA1C < 7%.    Hypertension Yes    Intervention Provide education on lifestyle modifcations including regular physical activity/exercise, weight management, moderate sodium restriction and increased consumption of fresh fruit, vegetables, and low fat dairy, alcohol moderation, and smoking cessation.;Monitor prescription use compliance.    Expected Outcomes Short Term: Continued assessment and intervention until BP is < 140/937mHG in hypertensive participants. < 130/8058mG in hypertensive participants with diabetes, heart failure or chronic kidney disease.;Long  Term: Maintenance of blood pressure at goal levels.    Lipids Yes    Intervention Provide education and support for participant on nutrition & aerobic/resistive exercise along with prescribed medications to achieve LDL <32m98mDL >40mg80m Expected Outcomes Short Term: Participant states understanding of desired cholesterol values and is compliant with medications prescribed. Participant is following exercise prescription and nutrition guidelines.;Long Term: Cholesterol controlled with medications as prescribed, with individualized exercise RX and with personalized nutrition plan. Value goals: LDL < 32mg,63m > 40 mg.             Education:Diabetes - Individual verbal and written instruction to review signs/symptoms of diabetes, desired ranges of glucose level fasting, after meals and with exercise. Acknowledge that pre and post exercise glucose checks will be done for 3 sessions at entry of program. Flowsheet [facility]  Date 11/08/21  [facility]- Verbalizes Understanding       Core Components/Risk Factors/Patient Goals Review:    Core Components/Risk Factors/Patient Goals at Discharge (Final Review):    ITP Comments:  ITP Comments     Row Name 11/08/21 0912 11/26/21 1109 12/05/21 0827 12/13/21 0754 12/25/21 0952   ITP Comments Virtual Visit completed. Patient informed on EP and RD appointment and 6 Minute walk test. Patient also informed of patient health questionnaires on My Chart. Patient Verbalizes understanding. Visit diagnosis [facility] be found [facility]. Completed 6MWT and gym orientation. Initial ITP created and sent for review to Dr. Mark MEmily Filbertcal Director. 30 Day review completed. Medical Director ITP review done, changes made as directed, and signed approval by Medical Director. First full day of exercise!  Patient was oriented to gym and equipment including functions, settings,  policies, and procedures.  Patient's individual exercise prescription and treatment plan were reviewed.  All starting workloads were established based on the results of the 6 minute walk test done at initial orientation visit.  The plan for exercise  progression was also introduced and progression will be customized based on patient's performance and goals. Hoy has been out since 12/13/21.  He came by today. He let us know that he tested positive for COVID last Wednesday on 12/19/21.  We requested that he stay out 10 days from positive test date.  He will return next week.    [place] Name 12/31/21 1145 12/31/21 1258 01/02/22 0912       ITP Comments Completed initial RD consultation Out since 12/13/21 with COVID 30 Day review completed. Medical Director ITP review done, changes made as directed, and signed approval by Medical Director.   out for medical reasons              Comments:

## 2022-01-03 ENCOUNTER — Other Ambulatory Visit: Payer: Self-pay

## 2022-01-03 DIAGNOSIS — Z955 Presence of coronary angioplasty implant and graft: Secondary | ICD-10-CM | POA: Diagnosis not present

## 2022-01-03 LAB — GLUCOSE, CAPILLARY
Glucose-Capillary: 238 mg/dL — ABNORMAL HIGH (ref 70–99)
Glucose-Capillary: 169 mg/dL — ABNORMAL HIGH (ref 70–99)

## 2022-01-03 NOTE — Progress Notes (Signed)
Daily Session Note  Patient Details  Name: Nicholas Knapp MRN: [id number] Date of Birth: February 26, 1956 Referring Provider:   Flowsheet Row Cardiac Rehab from 11/26/2021 in [facility] Cardiac and Pulmonary Rehab  Referring Provider Cloretta Ned MD       Encounter Date: 01/03/2022  Check In:  Session Check In - 01/03/22 0953       Check-In   Supervising physician immediately available to respond to emergencies See telemetry face sheet for immediately available ER MD    Location [facility]-Cardiac & Pulmonary Rehab    Staff Present Birdie Sons, MPA, Mauricia Area, BS, ACSM CEP, Exercise Physiologist;Amanda Oletta Darter, BA, ACSM CEP, Exercise Physiologist    Virtual Visit No    Medication changes reported     No    Fall or balance concerns reported    No    Warm-up and Cool-down Performed on first and last piece of equipment    Resistance Training Performed Yes    VAD Patient? No    PAD/SET Patient? No      Pain Assessment   Currently in Pain? No/denies                Social History   Tobacco Use  Smoking Status Former   Packs/day: 1.00   Years: 25.00   Pack years: 25.00   Types: Cigarettes   Quit date: 09/23/1994   Years since quitting: 27.2  Smokeless Tobacco Never    Goals Met:  Independence with exercise equipment Exercise tolerated well No report of concerns or symptoms today Strength training completed today  Goals Unmet:  Not Applicable  Comments: Pt able to follow exercise prescription today without complaint.  Will continue to monitor for progression.    Dr. Emily Filbert is Medical Director for [place].  Dr. Ottie Glazier is Medical Director for [facility] Pulmonary Rehabilitation.

## 2022-01-08 ENCOUNTER — Other Ambulatory Visit: Payer: Self-pay

## 2022-01-08 DIAGNOSIS — Z955 Presence of coronary angioplasty implant and graft: Secondary | ICD-10-CM

## 2022-01-08 NOTE — Progress Notes (Signed)
Daily Session Note  Patient Details  Name: Nicholas Knapp MRN: [id number] Date of Birth: 1956/01/23 Referring Provider:   Flowsheet Row Cardiac Rehab from 11/26/2021 in [facility] Cardiac and Pulmonary Rehab  Referring Provider Cloretta Ned MD       Encounter Date: 01/08/2022  Check In:  Session Check In - 01/08/22 0941       Check-In   Supervising physician immediately available to respond to emergencies See telemetry face sheet for immediately available ER MD    Location [facility]-Cardiac & Pulmonary Rehab    Staff Present Birdie Sons, MPA, RN;Jessica Luan Pulling, MA, RCEP, CCRP, CCET;Amanda Sommer, BA, ACSM CEP, Exercise Physiologist    Virtual Visit No    Medication changes reported     No    Fall or balance concerns reported    No    Warm-up and Cool-down Performed on first and last piece of equipment    Resistance Training Performed Yes    VAD Patient? No    PAD/SET Patient? No      Pain Assessment   Currently in Pain? No/denies                Social History   Tobacco Use  Smoking Status Former   Packs/day: 1.00   Years: 25.00   Pack years: 25.00   Types: Cigarettes   Quit date: 09/23/1994   Years since quitting: 27.3  Smokeless Tobacco Never    Goals Met:  Independence with exercise equipment Exercise tolerated well No report of concerns or symptoms today Strength training completed today  Goals Unmet:  Not Applicable  Comments: Pt able to follow exercise prescription today without complaint.  Will continue to monitor for progression.    Dr. Emily Filbert is Medical Director for [place].  Dr. Ottie Glazier is Medical Director for [facility] Pulmonary Rehabilitation.

## 2022-01-10 ENCOUNTER — Encounter: Payer: Medicare Other | Attending: Cardiovascular Disease

## 2022-01-10 ENCOUNTER — Other Ambulatory Visit: Payer: Self-pay

## 2022-01-10 DIAGNOSIS — Z955 Presence of coronary angioplasty implant and graft: Secondary | ICD-10-CM | POA: Insufficient documentation

## 2022-01-10 NOTE — Progress Notes (Signed)
Daily Session Note  Patient Details  Name: Nicholas Knapp MRN: [id number] Date of Birth: Nov 11, 1956 Referring Provider:   Flowsheet Row Cardiac Rehab from 11/26/2021 in [facility] Cardiac and Pulmonary Rehab  Referring Provider Cloretta Ned MD       Encounter Date: 01/10/2022  Check In:  Session Check In - 01/10/22 0944       Check-In   Supervising physician immediately available to respond to emergencies See telemetry face sheet for immediately available ER MD    Location [facility]-Cardiac & Pulmonary Rehab    Staff Present Birdie Sons, MPA, Elveria Rising, BA, ACSM CEP, Exercise Physiologist;Baldomero Mirarchi Amedeo Plenty, BS, ACSM CEP, Exercise Physiologist    Virtual Visit No    Medication changes reported     No    Fall or balance concerns reported    No    Warm-up and Cool-down Performed on first and last piece of equipment    Resistance Training Performed Yes    VAD Patient? No    PAD/SET Patient? No      Pain Assessment   Currently in Pain? No/denies                Social History   Tobacco Use  Smoking Status Former   Packs/day: 1.00   Years: 25.00   Pack years: 25.00   Types: Cigarettes   Quit date: 09/23/1994   Years since quitting: 27.3  Smokeless Tobacco Never    Goals Met:  Independence with exercise equipment Exercise tolerated well No report of concerns or symptoms today Strength training completed today  Goals Unmet:  Not Applicable  Comments: Pt able to follow exercise prescription today without complaint.  Will continue to monitor for progression.    Dr. Emily Filbert is Medical Director for [place].  Dr. Ottie Glazier is Medical Director for [facility] Pulmonary Rehabilitation.

## 2022-01-15 ENCOUNTER — Other Ambulatory Visit: Payer: Self-pay

## 2022-01-15 DIAGNOSIS — Z955 Presence of coronary angioplasty implant and graft: Secondary | ICD-10-CM | POA: Diagnosis not present

## 2022-01-15 NOTE — Progress Notes (Signed)
Daily Session Note  Patient Details  Name: Nicholas Knapp MRN: [id number] Date of Birth: 1956/08/26 Referring Provider:   Flowsheet Row Cardiac Rehab from 11/26/2021 in [facility] Cardiac and Pulmonary Rehab  Referring Provider Cloretta Ned MD       Encounter Date: 01/15/2022  Check In:  Session Check In - 01/15/22 0939       Check-In   Supervising physician immediately available to respond to emergencies See telemetry face sheet for immediately available ER MD    Location [facility]-Cardiac & Pulmonary Rehab    Staff Present Birdie Sons, MPA, RN;Amanda Oletta Darter, BA, ACSM CEP, Exercise Physiologist;Jessica Luan Pulling, MA, RCEP, CCRP, CCET    Virtual Visit No    Medication changes reported     No    Fall or balance concerns reported    No    Warm-up and Cool-down Performed on first and last piece of equipment    Resistance Training Performed Yes    VAD Patient? No    PAD/SET Patient? No      Pain Assessment   Currently in Pain? No/denies                Social History   Tobacco Use  Smoking Status Former   Packs/day: 1.00   Years: 25.00   Pack years: 25.00   Types: Cigarettes   Quit date: 09/23/1994   Years since quitting: 27.3  Smokeless Tobacco Never    Goals Met:  Independence with exercise equipment Exercise tolerated well No report of concerns or symptoms today Strength training completed today  Goals Unmet:  Not Applicable  Comments: Pt able to follow exercise prescription today without complaint.  Will continue to monitor for progression.    Dr. Emily Filbert is Medical Director for [place].  Dr. Ottie Glazier is Medical Director for [facility] Pulmonary Rehabilitation.

## 2022-01-17 ENCOUNTER — Other Ambulatory Visit: Payer: Self-pay

## 2022-01-17 DIAGNOSIS — Z955 Presence of coronary angioplasty implant and graft: Secondary | ICD-10-CM

## 2022-01-17 NOTE — Progress Notes (Addendum)
Daily Session Note  Patient Details  Name: Nicholas Knapp MRN: [id number] Date of Birth: 03-01-1956 Referring Provider:   Flowsheet Row Cardiac Rehab from 11/26/2021 in [facility] Cardiac and Pulmonary Rehab  Referring Provider Cloretta Ned MD       Encounter Date: 01/17/2022  Check In:  Session Check In - 01/17/22 0958       Check-In   Supervising physician immediately available to respond to emergencies See telemetry face sheet for immediately available ER MD    Location [facility]-Cardiac & Pulmonary Rehab    Staff Present Birdie Sons, MPA, RN;Amanda Oletta Darter, BA, ACSM CEP, Exercise Physiologist;Ayisha Pol Amedeo Plenty, BS, ACSM CEP, Exercise Physiologist;Melissa Caiola, RDN, LDN    Virtual Visit No    Medication changes reported     Yes    Comments off spiranolactone    Fall or balance concerns reported    No    Warm-up and Cool-down Performed on first and last piece of equipment    Resistance Training Performed Yes    VAD Patient? No    PAD/SET Patient? No      Pain Assessment   Currently in Pain? No/denies                Social History   Tobacco Use  Smoking Status Former   Packs/day: 1.00   Years: 25.00   Pack years: 25.00   Types: Cigarettes   Quit date: 09/23/1994   Years since quitting: 27.3  Smokeless Tobacco Never    Goals Met:  Independence with exercise equipment Exercise tolerated well No report of concerns or symptoms today Strength training completed today  Goals Unmet:  Not Applicable  Comments: Pt able to follow exercise prescription today without complaint.  Will continue to monitor for progression.    Dr. Emily Filbert is Medical Director for [place].  Dr. Ottie Glazier is Medical Director for [facility] Pulmonary Rehabilitation.

## 2022-01-22 ENCOUNTER — Other Ambulatory Visit: Payer: Self-pay

## 2022-01-22 DIAGNOSIS — Z955 Presence of coronary angioplasty implant and graft: Secondary | ICD-10-CM

## 2022-01-22 NOTE — Progress Notes (Signed)
Daily Session Note  Patient Details  Name: Nicholas Knapp MRN: [id number] Date of Birth: 1956/01/02 Referring Provider:   Flowsheet Row Cardiac Rehab from 11/26/2021 in [facility] Cardiac and Pulmonary Rehab  Referring Provider Cloretta Ned MD       Encounter Date: 01/22/2022  Check In:  Session Check In - 01/22/22 0950       Check-In   Supervising physician immediately available to respond to emergencies See telemetry face sheet for immediately available ER MD    Location [facility]-Cardiac & Pulmonary Rehab    Staff Present Birdie Sons, MPA, RN;Jessica Luan Pulling, MA, RCEP, CCRP, CCET;Amanda Sommer, BA, ACSM CEP, Exercise Physiologist    Virtual Visit No    Medication changes reported     No    Fall or balance concerns reported    No    Warm-up and Cool-down Performed on first and last piece of equipment    Resistance Training Performed Yes    VAD Patient? No    PAD/SET Patient? No      Pain Assessment   Currently in Pain? No/denies                Social History   Tobacco Use  Smoking Status Former   Packs/day: 1.00   Years: 25.00   Pack years: 25.00   Types: Cigarettes   Quit date: 09/23/1994   Years since quitting: 27.3  Smokeless Tobacco Never    Goals Met:  Independence with exercise equipment Exercise tolerated well No report of concerns or symptoms today Strength training completed today  Goals Unmet:  Not Applicable  Comments: Pt able to follow exercise prescription today without complaint.  Will continue to monitor for progression.    Dr. Emily Filbert is Medical Director for [place].  Dr. Ottie Glazier is Medical Director for [facility] Pulmonary Rehabilitation.

## 2022-01-29 ENCOUNTER — Other Ambulatory Visit: Payer: Self-pay

## 2022-01-29 DIAGNOSIS — Z955 Presence of coronary angioplasty implant and graft: Secondary | ICD-10-CM

## 2022-01-29 NOTE — Progress Notes (Signed)
Daily Session Note  Patient Details  Name: Nicholas Knapp MRN: [id number] Date of Birth: 1956/01/01 Referring Provider:   Flowsheet Row Cardiac Rehab from 11/26/2021 in [facility] Cardiac and Pulmonary Rehab  Referring Provider Cloretta Ned MD       Encounter Date: 01/29/2022  Check In:  Session Check In - 01/29/22 0941       Check-In   Supervising physician immediately available to respond to emergencies See telemetry face sheet for immediately available ER MD    Location [facility]-Cardiac & Pulmonary Rehab    Staff Present Birdie Sons, MPA, RN;Jessica Luan Pulling, MA, RCEP, CCRP, CCET;Amanda Sommer, BA, ACSM CEP, Exercise Physiologist    Virtual Visit No    Medication changes reported     No    Fall or balance concerns reported    No    Warm-up and Cool-down Performed on first and last piece of equipment    Resistance Training Performed Yes    VAD Patient? No    PAD/SET Patient? No      Pain Assessment   Currently in Pain? No/denies                Social History   Tobacco Use  Smoking Status Former   Packs/day: 1.00   Years: 25.00   Pack years: 25.00   Types: Cigarettes   Quit date: 09/23/1994   Years since quitting: 27.3  Smokeless Tobacco Never    Goals Met:  Independence with exercise equipment Exercise tolerated well No report of concerns or symptoms today Strength training completed today  Goals Unmet:  Not Applicable  Comments: Pt able to follow exercise prescription today without complaint.  Will continue to monitor for progression.    Dr. Emily Filbert is Medical Director for [place].  Dr. Ottie Glazier is Medical Director for [facility] Pulmonary Rehabilitation.

## 2022-01-30 ENCOUNTER — Encounter: Payer: Self-pay | Admitting: *Deleted

## 2022-01-30 DIAGNOSIS — Z955 Presence of coronary angioplasty implant and graft: Secondary | ICD-10-CM

## 2022-01-30 NOTE — Progress Notes (Signed)
Cardiac Individual Treatment Plan  Patient Details  Name: Nicholas Knapp MRN: [id number] Date of Birth: 09-12-1956 Referring Provider:   Flowsheet Row Cardiac Rehab from 11/26/2021 in [facility] Cardiac and Pulmonary Rehab  Referring Provider Cloretta Ned MD       Initial Encounter Date:  Flowsheet Row Cardiac Rehab from 11/26/2021 in [facility] Cardiac and Pulmonary Rehab  Date 11/26/21       Visit Diagnosis: Status post coronary artery stent placement  Patient's Home Medications on Admission:  Current Outpatient Medications:    aspirin 81 MG tablet, Take 81 mg by mouth every evening. , Disp: , Rfl:    azelastine (ASTELIN) 0.1 % nasal spray, Place into the nose., Disp: , Rfl:    CALCIUM POLYCARBOPHIL PO, Take 1 tablet by mouth daily., Disp: , Rfl:    clopidogrel (PLAVIX) 75 MG tablet, Take 75 mg by mouth daily., Disp: , Rfl:    clopidogrel (PLAVIX) 75 MG tablet, Take 1 tablet by mouth daily. (Patient not taking: Reported on 11/08/2021), Disp: , Rfl:    diphenhydrAMINE (BENADRYL) 25 MG tablet, Take 25 mg by mouth daily as needed for allergies., Disp: , Rfl:    DULoxetine (CYMBALTA) 60 MG capsule, Take 60 mg by mouth daily., Disp: , Rfl:    DULoxetine (CYMBALTA) 60 MG capsule, Take 1 capsule by mouth daily. (Patient not taking: Reported on 11/08/2021), Disp: , Rfl:    Empagliflozin (JARDIANCE PO), Take 20 mg by mouth daily., Disp: , Rfl:    EPINEPHrine 0.3 mg/0.3 mL IJ SOAJ injection, Inject into the muscle once., Disp: , Rfl:    gabapentin (NEURONTIN) 600 MG tablet, Take 1 tablet (600 mg total) by mouth every 8 (eight) hours., Disp: 90 tablet, Rfl: 2   gabapentin (NEURONTIN) 600 MG tablet, 2 (two) times daily (Patient not taking: Reported on 11/08/2021), Disp: , Rfl:    glimepiride (AMARYL) 2 MG tablet, Take 2 mg by mouth daily with breakfast., Disp: , Rfl:    glucose blood test strip, 1 each by Other route as needed for other. Use as instructed, Disp: , Rfl:    JARDIANCE 25 MG TABS  tablet, Take 25 mg by mouth daily., Disp: , Rfl:    losartan (COZAAR) 100 MG tablet, Take 100 mg by mouth daily., Disp: , Rfl:    losartan (COZAAR) 25 MG tablet, Take 25 mg by mouth every evening.  (Patient not taking: Reported on 11/08/2021), Disp: , Rfl:    metoprolol succinate (TOPROL-XL) 25 MG 24 hr tablet, Take 25 mg by mouth every evening. , Disp: , Rfl:    nitroGLYCERIN (NITROSTAT) 0.4 MG SL tablet, Place 0.4 mg under the tongue every 5 (five) minutes as needed for chest pain., Disp: , Rfl:    pantoprazole (PROTONIX) 40 MG tablet, Take 40 mg by mouth daily. , Disp: , Rfl:    pantoprazole (PROTONIX) 40 MG tablet, Take 1 tablet by mouth at bedtime. (Patient not taking: Reported on 11/08/2021), Disp: , Rfl:    polycarbophil (FIBERCON) 625 MG tablet, Take 625 mg by mouth every evening., Disp: , Rfl:    rosuvastatin (CRESTOR) 20 MG tablet, Take 20 mg by mouth every evening. , Disp: , Rfl:    spironolactone (ALDACTONE) 25 MG tablet, Take by mouth., Disp: , Rfl:    STUDY - ASPIRE - aspirin 81 mg or placebo tablet (PI-Sethi), Take 1 tablet by mouth every evening., Disp: , Rfl:    traZODone (DESYREL) 50 MG tablet, Take 50 mg by mouth at bedtime as  needed for sleep., Disp: , Rfl:   Past Medical History: Past Medical History:  Diagnosis Date   Allergic state    Anxiety    Asthma    Coronary artery disease    Depression    Diabetes mellitus without complication (HCC)    Diverticulosis    GERD (gastroesophageal reflux disease)    Hyperlipemia    Hypertension    Midsystolic murmur    Myocardial infarction ([place]) 1995 and 2008   Prostatitis    Proteinuria    Sleep apnea     Tobacco Use: Social History   Tobacco Use  Smoking Status Former   Packs/day: 1.00   Years: 25.00   Pack years: 25.00   Types: Cigarettes   Quit date: 09/23/1994   Years since quitting: 27.3  Smokeless Tobacco Never    Labs: Recent Review Flowsheet Data   There is no flowsheet data to display.       Exercise Target Goals: Exercise Program Goal: Individual exercise prescription set using results from initial 6 min walk test and THRR while considering  patients activity barriers and safety.   Exercise Prescription Goal: Initial exercise prescription builds to 30-45 minutes a day of aerobic activity, 2-3 days per week.  Home exercise guidelines will be given to patient during program as part of exercise prescription that the participant will acknowledge.   Education: Aerobic Exercise: - Group verbal and visual presentation on the components of exercise prescription. Introduces F.I.T.T principle from ACSM for exercise prescriptions.  Reviews F.I.T.T. principles of aerobic exercise including progression. Written material given at graduation. Flowsheet Row Cardiac Rehab from 01/03/2022 in [facility] Cardiac and Pulmonary Rehab  Education need identified 11/26/21       Education: Resistance Exercise: - Group verbal and visual presentation on the components of exercise prescription. Introduces F.I.T.T principle from ACSM for exercise prescriptions  Reviews F.I.T.T. principles of resistance exercise including progression. Written material given at graduation. Flowsheet Row Cardiac Rehab from 01/03/2022 in [facility] Cardiac and Pulmonary Rehab  Date 12/13/21  Educator [facility]  Instruction Review Code 1- Verbalizes Understanding        Education: Exercise & Equipment Safety: - Individual verbal instruction and demonstration of equipment use and safety with use of the equipment. Flowsheet Row Cardiac Rehab from 01/03/2022 in [facility] Cardiac and Pulmonary Rehab  Date 11/08/21  Educator [facility]  Instruction Review Code 1- Verbalizes Understanding       Education: Exercise Physiology & General Exercise Guidelines: - Group verbal and written instruction with models to review the exercise physiology of the cardiovascular system and associated critical values. Provides general exercise guidelines with specific  guidelines to those with heart or lung disease.    Education: Flexibility, Balance, Mind/Body Relaxation: - Group verbal and visual presentation with interactive activity on the components of exercise prescription. Introduces F.I.T.T principle from ACSM for exercise prescriptions. Reviews F.I.T.T. principles of flexibility and balance exercise training including progression. Also discusses the mind body connection.  Reviews various relaxation techniques to help reduce and manage stress (i.e. Deep breathing, progressive muscle relaxation, and visualization). Balance handout provided to take home. Written material given at graduation.   Activity Barriers & Risk Stratification:  Activity Barriers & Cardiac Risk Stratification - 11/26/21 1111       Activity Barriers & Cardiac Risk Stratification   Activity Barriers Muscular Weakness;Deconditioning;Shortness of Breath;Back Problems;Neck/Spine Problems;Balance Concerns   chronic back pain   Cardiac Risk Stratification High  6 Minute Walk:  6 Minute Walk     Row Name 11/26/21 1110         6 Minute Walk   Phase Initial     Distance 1275 feet     Walk Time 6 minutes     # of Rest Breaks 0     MPH 2.41     METS 3     RPE 11     Perceived Dyspnea  1     VO2 Peak 10.53     Symptoms Yes (comment)     Comments SOB     Resting HR 66 bpm     Resting BP 108/64     Resting Oxygen Saturation  97 %     Exercise Oxygen Saturation  during 6 min walk 96 %     Max Ex. HR 128 bpm     Max Ex. BP 122/68     2 Minute Post BP 112/60              Oxygen Initial Assessment:   Oxygen Re-Evaluation:   Oxygen Discharge (Final Oxygen Re-Evaluation):   Initial Exercise Prescription:  Initial Exercise Prescription - 11/26/21 1100       Date of Initial Exercise RX and Referring Provider   Date 11/26/21    Referring Provider Cloretta Ned MD      Oxygen   Maintain Oxygen Saturation 88% or higher      Treadmill   MPH  2.2    Grade 0.5    Minutes 15    METs 2.84      Recumbant Elliptical   Level 1    RPM 50    Minutes 15    METs 2.5      REL-XR   Level 2    Speed 50    Minutes 15    METs 2.5      Prescription Details   Frequency (times per week) 2    Duration Progress to 30 minutes of continuous aerobic without signs/symptoms of physical distress      Intensity   THRR 40-80% of Max Heartrate 101-136    Ratings of Perceived Exertion 11-13    Perceived Dyspnea 0-4      Progression   Progression Continue to progress workloads to maintain intensity without signs/symptoms of physical distress.      Resistance Training   Training Prescription Yes    Weight 4 lb    Reps 10-15             Perform Capillary Blood Glucose checks as needed.  Exercise Prescription Changes:   Exercise Prescription Changes     Row Name 11/26/21 1100 12/17/21 0900 01/10/22 1000 01/15/22 1600 01/28/22 1600     Response to Exercise   Blood Pressure (Admit) 108/64 102/50 -- 112/60 102/52   Blood Pressure (Exercise) 122/68 126/60 -- 122/54 128/72   Blood Pressure (Exit) 112/60 110/62 -- 110/60 126/62   Heart Rate (Admit) 66 bpm 64 bpm -- 60 bpm 85 bpm   Heart Rate (Exercise) 128 bpm 100 bpm -- 114 bpm 102 bpm   Heart Rate (Exit) 66 bpm 85 bpm -- 88 bpm 79 bpm   Oxygen Saturation (Admit) 97 % -- -- -- --   Oxygen Saturation (Exercise) 96 % -- -- -- --   Rating of Perceived Exertion (Exercise) 11 12 -- 12 12   Perceived Dyspnea (Exercise) 1 -- -- -- --   Symptoms SOB none -- none none  Comments walk test results 1st full day of exercise -- -- --   Duration -- Progress to 30 minutes of  aerobic without signs/symptoms of physical distress -- Continue with 30 min of aerobic exercise without signs/symptoms of physical distress. Continue with 30 min of aerobic exercise without signs/symptoms of physical distress.   Intensity -- THRR unchanged -- THRR unchanged THRR unchanged     Progression   Progression --  Continue to progress workloads to maintain intensity without signs/symptoms of physical distress. -- Continue to progress workloads to maintain intensity without signs/symptoms of physical distress. Continue to progress workloads to maintain intensity without signs/symptoms of physical distress.   Average METs -- 2.62 -- 3 3.4     Resistance Training   Training Prescription -- Yes -- Yes Yes   Weight -- 4 lb -- 4 lb 4 lb   Reps -- 10-15 -- 10-15 10-15     Interval Training   Interval Training -- No -- No No     Treadmill   MPH -- 2.2 -- 2.5 2.5   Grade -- 0.5 -- 0.5 1.5   Minutes -- 15 -- 15 15   METs -- 2.84 -- 3.09 3.43     Elliptical   Level -- -- -- 1 --   Minutes -- -- -- 15 --     REL-XR   Level -- 2 -- 4 --   Minutes -- 15 -- 15 --   METs -- 2.4 -- -- --     Home Exercise Plan   Plans to continue exercise at -- -- Home (comment)  walking Home (comment)  walking Home (comment)  walking   Frequency -- -- Add 3 additional days to program exercise sessions.  start with 1 Add 3 additional days to program exercise sessions.  start with 1 Add 3 additional days to program exercise sessions.  start with 1   Initial Home Exercises Provided -- -- 01/10/22 01/10/22 01/10/22     Oxygen   Maintain Oxygen Saturation -- 88% or higher 88% or higher 88% or higher 88% or higher            Exercise Comments:   Exercise Comments     Row Name 12/13/21 0755           Exercise Comments First full day of exercise!  Patient was oriented to gym and equipment including functions, settings, policies, and procedures.  Patient's individual exercise prescription and treatment plan were reviewed.  All starting workloads were established based on the results of the 6 minute walk test done at initial orientation visit.  The plan for exercise progression was also introduced and progression will be customized based on patient's performance and goals.                Exercise Goals and  Review:   Exercise Goals     Row Name 11/26/21 1114             Exercise Goals   Increase Physical Activity Yes       Intervention Provide advice, education, support and counseling about physical activity/exercise needs.;Develop an individualized exercise prescription for aerobic and resistive training based on initial evaluation findings, risk stratification, comorbidities and participant's personal goals.       Expected Outcomes Short Term: Attend rehab on a regular basis to increase amount of physical activity.;Long Term: Exercising regularly at least 3-5 days a week.;Long Term: Add in home exercise to make exercise part of routine and to  increase amount of physical activity.       Increase Strength and Stamina Yes       Intervention Provide advice, education, support and counseling about physical activity/exercise needs.;Develop an individualized exercise prescription for aerobic and resistive training based on initial evaluation findings, risk stratification, comorbidities and participant's personal goals.       Expected Outcomes Short Term: Increase workloads from initial exercise prescription for resistance, speed, and METs.;Short Term: Perform resistance training exercises routinely during rehab and add in resistance training at home;Long Term: Improve cardiorespiratory fitness, muscular endurance and strength as measured by increased METs and functional capacity (6MWT)       Able to understand and use rate of perceived exertion (RPE) scale Yes       Intervention Provide education and explanation on how to use RPE scale       Expected Outcomes Short Term: Able to use RPE daily in rehab to express subjective intensity level;Long Term:  Able to use RPE to guide intensity level when exercising independently       Able to understand and use Dyspnea scale Yes       Intervention Provide education and explanation on how to use Dyspnea scale       Expected Outcomes Short Term: Able to use  Dyspnea scale daily in rehab to express subjective sense of shortness of breath during exertion;Long Term: Able to use Dyspnea scale to guide intensity level when exercising independently       Knowledge and understanding of Target Heart Rate Range (THRR) Yes       Intervention Provide education and explanation of THRR including how the numbers were predicted and where they are located for reference       Expected Outcomes Short Term: Able to state/look up THRR;Short Term: Able to use daily as guideline for intensity in rehab;Long Term: Able to use THRR to govern intensity when exercising independently       Able to check pulse independently Yes       Intervention Provide education and demonstration on how to check pulse in carotid and radial arteries.;Review the importance of being able to check your own pulse for safety during independent exercise       Expected Outcomes Short Term: Able to explain why pulse checking is important during independent exercise;Long Term: Able to check pulse independently and accurately       Understanding of Exercise Prescription Yes       Intervention Provide education, explanation, and written materials on patient's individual exercise prescription       Expected Outcomes Short Term: Able to explain program exercise prescription;Long Term: Able to explain home exercise prescription to exercise independently                Exercise Goals Re-Evaluation :  Exercise Goals Re-Evaluation     Row Name 12/13/21 0755 12/17/21 0921 12/31/21 1258 01/10/22 1028 01/15/22 1636     Exercise Goal Re-Evaluation   Exercise Goals Review Increase Physical Activity;Able to understand and use rate of perceived exertion (RPE) scale;Knowledge and understanding of Target Heart Rate Range (THRR);Understanding of Exercise Prescription;Increase Strength and Stamina;Able to understand and use Dyspnea scale;Able to check pulse independently Increase Physical Activity;Increase Strength and  Stamina -- Increase Physical Activity;Increase Strength and Stamina;Understanding of Exercise Prescription Increase Physical Activity;Increase Strength and Stamina   Comments Reviewed RPE and dyspnea scales, THR and program prescription with pt today.  Pt voiced understanding and was given a copy of goals to take  home. Gilles did well for his first full session of exercise. He tolerated his initial exercise prescription loads and maintained a RPE of 12. Will continue to monitor as he progresses throughout the program. Out since 12/13/21 with COVID.  Scheduled to return this week. Reviewed home exercise with pt today.  Pt plans to walk  for exercise.  Reviewed THR, pulse, RPE, sign and symptoms, pulse oximetery and when to call 911 or MD.  Also discussed weather considerations and indoor options.  Pt voiced understanding. Elvyn is doing well in rehab. He has increased his speed on the treadmill to 2.5 as well as increased to level 4 on the XR. He also tried the elliptical and tolerated it for 5 minutes! Will continue to monitor.   Expected Outcomes Short: Use RPE daily to regulate intensity. Long: Follow program prescription in THR. Short: Continue current exercise prescription Long: Increase overall strength and MET level -- Short: Add on 1 day of exercise at home Long: Exercise independently at home at appropriate prescription Short: Build up tolerance on elliptical Long: Continue to increase overall MET level    Row Name 01/28/22 1622             Exercise Goal Re-Evaluation   Exercise Goals Review Increase Physical Activity;Increase Strength and Stamina       Comments Jaiel is progressing well. He has increased incline to 1.5 % on TM.  He reaches lower end of THR range.  He would benefit from trying 5 lb weights.       Expected Outcomes Short: try 5 lb amd continue to work on elliptical Long:  build overall stamina                Discharge Exercise Prescription (Final Exercise Prescription  Changes):  Exercise Prescription Changes - 01/28/22 1600       Response to Exercise   Blood Pressure (Admit) 102/52    Blood Pressure (Exercise) 128/72    Blood Pressure (Exit) 126/62    Heart Rate (Admit) 85 bpm    Heart Rate (Exercise) 102 bpm    Heart Rate (Exit) 79 bpm    Rating of Perceived Exertion (Exercise) 12    Symptoms none    Duration Continue with 30 min of aerobic exercise without signs/symptoms of physical distress.    Intensity THRR unchanged      Progression   Progression Continue to progress workloads to maintain intensity without signs/symptoms of physical distress.    Average METs 3.4      Resistance Training   Training Prescription Yes    Weight 4 lb    Reps 10-15      Interval Training   Interval Training No      Treadmill   MPH 2.5    Grade 1.5    Minutes 15    METs 3.43      Home Exercise Plan   Plans to continue exercise at Home (comment)   walking   Frequency Add 3 additional days to program exercise sessions.   start with 1   Initial Home Exercises Provided 01/10/22      Oxygen   Maintain Oxygen Saturation 88% or higher             Nutrition:  Target Goals: Understanding of nutrition guidelines, daily intake of sodium <1541m, cholesterol <2068m calories 30% from fat and 7% or less from saturated fats, daily to have 5 or more servings of fruits and vegetables.  Education: All About Nutrition: -Group  instruction provided by verbal, written material, interactive activities, discussions, models, and posters to present general guidelines for heart healthy nutrition including fat, fiber, MyPlate, the role of sodium in heart healthy nutrition, utilization of the nutrition label, and utilization of this knowledge for meal planning. Follow up email sent as well. Written material given at graduation. Flowsheet Row Cardiac Rehab from 01/03/2022 in [facility] Cardiac and Pulmonary Rehab  Education need identified 11/26/21  Date 01/03/22  Educator Echo   Instruction Review Code 1- Verbalizes Understanding       Biometrics:  Pre Biometrics - 11/26/21 1114       Pre Biometrics   Height 5' 9.8" (1.773 m)    Weight 197 lb 6.4 oz (89.5 kg)    BMI (Calculated) 28.48    Single Leg Stand 1.6 seconds              Nutrition Therapy Plan and Nutrition Goals:  Nutrition Therapy & Goals - 12/31/21 1001       Nutrition Therapy   Diet Heart healthy, low Na, T2DM friendly    Drug/Food Interactions Statins/Certain Fruits    Protein (specify units) 70g    Fiber 30 grams    Whole Grain Foods 3 servings    Saturated Fats 12 max. grams    Fruits and Vegetables 8 servings/day    Sodium 2 grams      Personal Nutrition Goals   Nutrition Goal ST: consider trying daves killer bread for his whole grains, adding healthy fats with fruit like peanut butter with grapes LT: maintain A1C <7, limit Na <2g/day, make at least 1/2 grains whole grains    Comments 66 y.o. M admitted to Cardiac/Pulmonary rehab s/p stent placement. PMHx includes. PSHx includes CAD, T2DM (A1C 12/19/21: 7.1), dyspnea on exertion, dyspepsia, HTN. Relevant medications include fibercon, cymbalta, jardiance, glimepiride, protonix, crestor, trazadone. Subjective: He reports lowering sugar intake. B: egg sandwich (white bread) L: burger or something out, sandwich at home like pimento cheese D: lean meats and salads a couple of times per week. He reports eating fruit more at snacks and meals - he doesn't snack often. Drinks: water, coke occasionally. He will cook with olive oil, goes out to eat <1x/week, uses some salt after cooking. He reports having nutrition education at [facility] since he was 23. PYP Score: 62. Vegetables & Fruits 9/12. Breads, Grains & Cereals 6/12. Red & Processed Meat 10/12. Poultry 0/2. Fish & Shellfish 1/4. Beans, Nuts & Seeds 0/4. Milk & Dairy Foods 4/6. Toppings, Oils, Seasonings & Salt 13/20. Sweets, Snacks & Restaurant Food 12/14. Beverages 7/10. Discussed diabetes and  heart healthy nutrition. Suggested switching to whole wheat bread and adding in heart healthy fats like nuts and seeds with his fruit.      Intervention Plan   Intervention Prescribe, educate and counsel regarding individualized specific dietary modifications aiming towards targeted core components such as weight, hypertension, lipid management, diabetes, heart failure and other comorbidities.    Expected Outcomes Short Term Goal: Understand basic principles of dietary content, such as calories, fat, sodium, cholesterol and nutrients.;Short Term Goal: A plan has been developed with personal nutrition goals set during dietitian appointment.;Long Term Goal: Adherence to prescribed nutrition plan.             Nutrition Assessments:  MEDIFICTS Score Key: ?70 Need to make dietary changes  40-70 Heart Healthy Diet ? 40 Therapeutic Level Cholesterol Diet  Flowsheet Row Cardiac Rehab from 11/26/2021 in [facility] Cardiac and Pulmonary Rehab  Picture Your Plate  Total Score on Admission 62      Picture Your Plate Scores: <62 Unhealthy dietary pattern with much room for improvement. 41-50 Dietary pattern unlikely to meet recommendations for good health and room for improvement. 51-60 More healthful dietary pattern, with some room for improvement.  >60 Healthy dietary pattern, although there may be some specific behaviors that could be improved.    Nutrition Goals Re-Evaluation:  Nutrition Goals Re-Evaluation     [place] Name 01/10/22 1033             Goals   Nutrition Goal ST: consider trying daves killer bread for his whole grains, adding healthy fats with fruit like peanut butter with grapes LT: maintain A1C <7, limit Na <2g/day, make at least 1/2 grains whole grains       Comment Patient recently saw RD and is currently following the recommendations. Patient will continue to follow and staff will follow up on any changes.       Expected Outcome Short: Follow current RD guidelines Long:  Continue to eat heart healthy diet                Nutrition Goals Discharge (Final Nutrition Goals Re-Evaluation):  Nutrition Goals Re-Evaluation - 01/10/22 1033       Goals   Nutrition Goal ST: consider trying daves killer bread for his whole grains, adding healthy fats with fruit like peanut butter with grapes LT: maintain A1C <7, limit Na <2g/day, make at least 1/2 grains whole grains    Comment Patient recently saw RD and is currently following the recommendations. Patient will continue to follow and staff will follow up on any changes.    Expected Outcome Short: Follow current RD guidelines Long: Continue to eat heart healthy diet             Psychosocial: Target Goals: Acknowledge presence or absence of significant depression and/or stress, maximize coping skills, provide positive support system. Participant is able to verbalize types and ability to use techniques and skills needed for reducing stress and depression.   Education: Stress, Anxiety, and Depression - Group verbal and visual presentation to define topics covered.  Reviews how body is impacted by stress, anxiety, and depression.  Also discusses healthy ways to reduce stress and to treat/manage anxiety and depression.  Written material given at graduation. Flowsheet Row Cardiac Rehab from 01/03/2022 in [facility] Cardiac and Pulmonary Rehab  Education need identified 11/26/21       Education: Sleep Hygiene -Provides group verbal and written instruction about how sleep can affect your health.  Define sleep hygiene, discuss sleep cycles and impact of sleep habits. Review good sleep hygiene tips.    Initial Review & Psychosocial Screening:  Initial Psych Review & Screening - 11/08/21 0916       Initial Review   Current issues with Current Depression;History of Depression;Current Psychotropic Meds      Family Dynamics   Good Support System? Yes    Comments His health issues have made it hard for him and he takes  medication to help cope with depression. He has a good family support system. He can look to his wife and his sister for support.      Barriers   Psychosocial barriers to participate in program The patient should benefit from training in stress management and relaxation.      Screening Interventions   Interventions Provide feedback about the scores to participant;Encouraged to exercise;To provide support and resources with identified psychosocial needs  Expected Outcomes Short Term goal: Utilizing psychosocial counselor, staff and physician to assist with identification of specific Stressors or current issues interfering with healing process. Setting desired goal for each stressor or current issue identified.;Long Term Goal: Stressors or current issues are controlled or eliminated.;Short Term goal: Identification and review with participant of any Quality of Life or Depression concerns found by scoring the questionnaire.;Long Term goal: The participant improves quality of Life and PHQ9 Scores as seen by post scores and/or verbalization of changes             Quality of Life Scores:   Quality of Life - 11/26/21 1115       Quality of Life   Select Quality of Life      Quality of Life Scores   Health/Function Pre 22.4 %    Socioeconomic Pre 27.86 %    Psych/Spiritual Pre 28.29 %    Family Pre 30 %    GLOBAL Pre 25.73 %            Scores of 19 and below usually indicate a poorer quality of life in these areas.  A difference of  2-3 points is a clinically meaningful difference.  A difference of 2-3 points in the total score of the Quality of Life Index has been associated with significant improvement in overall quality of life, self-image, physical symptoms, and general health in studies assessing change in quality of life.  PHQ-9: Recent Review Flowsheet Data     Depression screen [facility] 2/9 11/26/2021 01/30/2021 10/03/2020   Decreased Interest 1 0 0   Down, Depressed, Hopeless 0 0  0   PHQ - 2 Score 1 0 0   Altered sleeping 1 - -   Tired, decreased energy 1 - -   Change in appetite 0 - -   Feeling bad or failure about yourself  0 - -   Trouble concentrating 0 - -   Moving slowly or fidgety/restless 0 - -   Suicidal thoughts 0 - -   PHQ-9 Score 3 - -   Difficult doing work/chores Somewhat difficult - -      Interpretation of Total Score  Total Score Depression Severity:  1-4 = Minimal depression, 5-9 = Mild depression, 10-14 = Moderate depression, 15-19 = Moderately severe depression, 20-27 = Severe depression   Psychosocial Evaluation and Intervention:  Psychosocial Evaluation - 11/08/21 0917       Psychosocial Evaluation & Interventions   Interventions Encouraged to exercise with the program and follow exercise prescription;Relaxation education;Stress management education    Comments His health issues have made it hard for him and he takes medication to help cope with depression. He has a good family support system. He can look to his wife and his sister for support.    Expected Outcomes Short: Start [facility] to help with mood. Long: Maintain a healthy mental state    Continue Psychosocial Services  Follow up required by staff             Psychosocial Re-Evaluation:  Psychosocial Re-Evaluation     [place] Name 01/10/22 1001             Psychosocial Re-Evaluation   Current issues with Current Psychotropic Meds       Comments Kavaughn is doing well mentally. He has good support at home, mostly his wife. Sleep is good. He is staying compliant with medications and feels that it is helping him. His biggest worry at the moment is stabilizing his BP  as it has been fluctuating a lot. He is in the process attempting to talk to his doctor.       Expected Outcomes Short:  Talk to doctor about BP Long: Continue to utilize exercise for stress management and maintain positive attitude       Interventions Encouraged to attend Pulmonary Rehabilitation for the  exercise       Continue Psychosocial Services  Follow up required by staff                Psychosocial Discharge (Final Psychosocial Re-Evaluation):  Psychosocial Re-Evaluation - 01/10/22 1001       Psychosocial Re-Evaluation   Current issues with Current Psychotropic Meds    Comments Estell is doing well mentally. He has good support at home, mostly his wife. Sleep is good. He is staying compliant with medications and feels that it is helping him. His biggest worry at the moment is stabilizing his BP as it has been fluctuating a lot. He is in the process attempting to talk to his doctor.    Expected Outcomes Short:  Talk to doctor about BP Long: Continue to utilize exercise for stress management and maintain positive attitude    Interventions Encouraged to attend Pulmonary Rehabilitation for the exercise    Continue Psychosocial Services  Follow up required by staff             Vocational Rehabilitation: Provide vocational rehab assistance to qualifying candidates.   Vocational Rehab Evaluation & Intervention:   Education: Education Goals: Education classes will be provided on a variety of topics geared toward better understanding of heart health and risk factor modification. Participant will state understanding/return demonstration of topics presented as noted by education test scores.  Learning Barriers/Preferences:  Learning Barriers/Preferences - 11/08/21 0913       Learning Barriers/Preferences   Learning Barriers None    Learning Preferences None             General Cardiac Education Topics:  AED/CPR: - Group verbal and written instruction with the use of models to demonstrate the basic use of the AED with the basic ABC's of resuscitation.   Anatomy and Cardiac Procedures: - Group verbal and visual presentation and models provide information about basic cardiac anatomy and function. Reviews the testing methods done to diagnose heart disease and the  outcomes of the test results. Describes the treatment choices: Medical Management, Angioplasty, or Coronary Bypass Surgery for treating various heart conditions including Myocardial Infarction, Angina, Valve Disease, and Cardiac Arrhythmias.  Written material given at graduation. Flowsheet Row Cardiac Rehab from 01/03/2022 in [facility] Cardiac and Pulmonary Rehab  Date 12/13/21  Educator SB  Instruction Review Code 1- Verbalizes Understanding       Medication Safety: - Group verbal and visual instruction to review commonly prescribed medications for heart and lung disease. Reviews the medication, class of the drug, and side effects. Includes the steps to properly store meds and maintain the prescription regimen.  Written material given at graduation.   Intimacy: - Group verbal instruction through game format to discuss how heart and lung disease can affect sexual intimacy. Written material given at graduation.. Flowsheet Row Cardiac Rehab from 01/03/2022 in [facility] Cardiac and Pulmonary Rehab  Education need identified 11/26/21       Know Your Numbers and Heart Failure: - Group verbal and visual instruction to discuss disease risk factors for cardiac and pulmonary disease and treatment options.  Reviews associated critical values for Overweight/Obesity, Hypertension, Cholesterol, and Diabetes.  Discusses basics of heart failure: signs/symptoms and treatments.  Introduces Heart Failure Zone chart for action plan for heart failure.  Written material given at graduation. Flowsheet Row Cardiac Rehab from 01/03/2022 in [facility] Cardiac and Pulmonary Rehab  Education need identified 11/26/21       Infection Prevention: - Provides verbal and written material to individual with discussion of infection control including proper hand washing and proper equipment cleaning during exercise session. Flowsheet Row Cardiac Rehab from 01/03/2022 in [facility] Cardiac and Pulmonary Rehab  Date 11/08/21  Educator [facility] [address]   Instruction Review Code 1- Verbalizes Understanding       Falls Prevention: - Provides verbal and written material to individual with discussion of falls prevention and safety. Flowsheet Row Cardiac Rehab from 01/03/2022 in [facility] Cardiac and Pulmonary Rehab  Date 11/08/21  Educator [facility]  Instruction Review Code 1- Verbalizes Understanding       Other: -Provides group and verbal instruction on various topics (see comments)   Knowledge Questionnaire Score:  Knowledge Questionnaire Score - 11/26/21 1116       Knowledge Questionnaire Score   Pre Score 20/26             Core Components/Risk Factors/Patient Goals at Admission:  Personal Goals and Risk Factors at Admission - 11/26/21 1116       Core Components/Risk Factors/Patient Goals on Admission    Weight Management Yes;Weight Loss;Weight Maintenance    Intervention Weight Management: Develop a combined nutrition and exercise program designed to reach desired caloric intake, while maintaining appropriate intake of nutrient and fiber, sodium and fats, and appropriate energy expenditure required for the weight goal.;Weight Management: Provide education and appropriate resources to help participant work on and attain dietary goals.;Weight Management/Obesity: Establish reasonable short term and long term weight goals.    Admit Weight 197 lb 6.4 oz (89.5 kg)    Goal Weight: Short Term 192 lb (87.1 kg)    Goal Weight: Long Term 190 lb (86.2 kg)    Expected Outcomes Long Term: Adherence to nutrition and physical activity/exercise program aimed toward attainment of established weight goal;Short Term: Continue to assess and modify interventions until short term weight is achieved;Weight Loss: Understanding of general recommendations for a balanced deficit meal plan, which promotes 1-2 lb weight loss per week and includes a negative energy balance of [phone] kcal/d;Understanding recommendations for meals to include 15-35% energy as  protein, 25-35% energy from fat, 35-60% energy from carbohydrates, less than 273m of dietary cholesterol, 20-35 gm of total fiber daily;Understanding of distribution of calorie intake throughout the day with the consumption of 4-5 meals/snacks    Diabetes Yes    Intervention Provide education about signs/symptoms and action to take for hypo/hyperglycemia.;Provide education about proper nutrition, including hydration, and aerobic/resistive exercise prescription along with prescribed medications to achieve blood glucose in normal ranges: Fasting glucose 65-99 mg/dL    Expected Outcomes Short Term: Participant verbalizes understanding of the signs/symptoms and immediate care of hyper/hypoglycemia, proper foot care and importance of medication, aerobic/resistive exercise and nutrition plan for blood glucose control.;Long Term: Attainment of HbA1C < 7%.    Hypertension Yes    Intervention Provide education on lifestyle modifcations including regular physical activity/exercise, weight management, moderate sodium restriction and increased consumption of fresh fruit, vegetables, and low fat dairy, alcohol moderation, and smoking cessation.;Monitor prescription use compliance.    Expected Outcomes Short Term: Continued assessment and intervention until BP is < 140/965mHG in hypertensive participants. < 130/8052mG in hypertensive participants with diabetes, heart failure  or chronic kidney disease.;Long Term: Maintenance of blood pressure at goal levels.    Lipids Yes    Intervention Provide education and support for participant on nutrition & aerobic/resistive exercise along with prescribed medications to achieve LDL <75m, HDL >448m    Expected Outcomes Short Term: Participant states understanding of desired cholesterol values and is compliant with medications prescribed. Participant is following exercise prescription and nutrition guidelines.;Long Term: Cholesterol controlled with medications as prescribed,  with individualized exercise RX and with personalized nutrition plan. Value goals: LDL < 7025mHDL > 40 mg.             Education:Diabetes - Individual verbal and written instruction to review signs/symptoms of diabetes, desired ranges of glucose level fasting, after meals and with exercise. Acknowledge that pre and post exercise glucose checks will be done for 3 sessions at entry of program. [facility]  Date 11/08/21  [facility] Code 1- Verbalizes Understanding       Core Components/Risk Factors/Patient Goals Review:   Goals and Risk Factor Review     Row Name 01/10/22 [phone]          Core Components/Risk Factors/Patient Goals Review   Personal Goals Review Weight Management/Obesity;Diabetes;Hypertension       Review RobRiggs doing well. He is checking his blood sugars once/ day. Typically around 120 which is doctor is happy witth. A1C is maintained around 7%. RobRaedens lost 12 pounds since October and he continues to try to work on weight loss. BP has been checked 2-3x/day, however, his BPs have been fluctuating all over the place between lows & highs. He tried calling  his doctor and they have not replied to him yet. He is going to try calling again to see if his medications need to be changed.       Expected Outcomes Short: Call doctor and figure out BP problems Long: Continue to manage lifestyle risk factors                Core Components/Risk Factors/Patient Goals at Discharge (Final Review):   Goals and Risk Factor Review - 01/10/22 0956       Core Components/Risk Factors/Patient Goals Review   Personal Goals Review Weight Management/Obesity;Diabetes;Hypertension    Review RobJansen doing well. He is checking his blood sugars once/ day. Typically around 120 which is doctor is happy witth. A1C is maintained around 7%. RobDamonies lost 12 pounds since October and he continues to  try to work on weight loss. BP has been checked 2-3x/day, however, his BPs have been fluctuating all over the place between lows & highs. He tried calling  his doctor and they have not replied to him yet. He is going to try calling again to see if his medications need to be changed.    Expected Outcomes Short: Call doctor and figure out BP problems Long: Continue to manage lifestyle risk factors             ITP Comments:  ITP Comments     Row Name 11/08/21 0912 11/26/21 1109 12/05/21 0827 12/13/21 0754 12/25/21 0952   ITP Comments Virtual Visit completed. Patient informed on EP and RD appointment and 6 Minute walk test. Patient also informed of patient health questionnaires on My Chart. Patient Verbalizes understanding. [facility] Completed 6MWT and gym orientation. Initial ITP created and sent for review to  Dr. Emily Filbert, Medical Director. 30 Day review completed. Medical Director ITP review done, changes made as directed, and signed approval by Medical Director. First full day of exercise!  Patient was oriented to gym and equipment including functions, settings, policies, and procedures.  Patient's individual exercise prescription and treatment plan were reviewed.  All starting workloads were established based on the results of the 6 minute walk test done at initial orientation visit.  The plan for exercise progression was also introduced and progression will be customized based on patient's performance and goals. Shamarion has been out since 12/13/21.  He came by today. He let us know that he tested positive for COVID last Wednesday on 12/19/21.  We requested that he stay out 10 days from positive test date.  He will return next week.    [place] Name 12/31/21 1145 12/31/21 1258 01/02/22 0912 01/30/22 0902     ITP Comments Completed initial RD consultation Out since 12/13/21 with COVID 30 Day review completed. Medical Director ITP review done, changes made as directed,  and signed approval by Medical Director.   out for medical reasons 30 Day review completed. Medical Director ITP review done, changes made as directed, and signed approval by Medical Director.             Comments:

## 2022-02-05 ENCOUNTER — Other Ambulatory Visit: Payer: Self-pay

## 2022-02-05 DIAGNOSIS — Z955 Presence of coronary angioplasty implant and graft: Secondary | ICD-10-CM

## 2022-02-05 NOTE — Progress Notes (Signed)
Daily Session Note  Patient Details  Name: Nicholas Knapp MRN: [id number] Date of Birth: [birth year] Referring Provider:   Flowsheet Row Cardiac Rehab from 11/26/2021 in [facility] Cardiac and Pulmonary Rehab  Referring Provider Cloretta Ned MD       Encounter Date: 02/05/2022  Check In:  Session Check In - 02/05/22 0939       Check-In   Supervising physician immediately available to respond to emergencies See telemetry face sheet for immediately available ER MD    Location [facility]-Cardiac & Pulmonary Rehab    Staff Present Birdie Sons, MPA, RN;Melissa [place], RDN, LDN;Jessica [place], MA, RCEP, CCRP, CCET;Amanda Sommer, BA, ACSM CEP, Exercise Physiologist    Virtual Visit No    Medication changes reported     No    Fall or balance concerns reported    No    Warm-up and Cool-down Performed on first and last piece of equipment    Resistance Training Performed Yes    VAD Patient? No    PAD/SET Patient? No      Pain Assessment   Currently in Pain? No/denies                Social History   Tobacco Use  Smoking Status Former   Packs/day: 1.00   Years: 25.00   Pack years: 25.00   Types: Cigarettes   Quit date: 09/23/1994   Years since quitting: 27.3  Smokeless Tobacco Never    Goals Met:  Independence with exercise equipment Exercise tolerated well No report of concerns or symptoms today Strength training completed today  Goals Unmet:  Not Applicable  Comments: Pt able to follow exercise prescription today without complaint.  Will continue to monitor for progression.    Dr. Emily Filbert is Medical Director for [place].  Dr. Ottie Glazier is Medical Director for [facility] Pulmonary Rehabilitation.

## 2022-02-12 ENCOUNTER — Encounter: Payer: Medicare Other | Attending: Cardiovascular Disease

## 2022-02-12 ENCOUNTER — Other Ambulatory Visit: Payer: Self-pay

## 2022-02-12 DIAGNOSIS — Z955 Presence of coronary angioplasty implant and graft: Secondary | ICD-10-CM | POA: Diagnosis present

## 2022-02-12 NOTE — Progress Notes (Signed)
Daily Session Note ? ?Patient Details  ?Name: Nicholas Knapp ?MRN: [id number] ?Date of Birth: 1956-03-09 ?Referring Provider:   ?Flowsheet Row Cardiac Rehab from 11/26/2021 in [facility] Cardiac and Pulmonary Rehab  ?Referring Provider Cloretta Ned MD  ? ?  ? ? ?Encounter Date: 02/12/2022 ? ?Check In: ? Session Check In - 02/12/22 0941   ? ?  ? Check-In  ? Supervising physician immediately available to respond to emergencies See telemetry face sheet for immediately available ER MD   ? Location [facility]-Cardiac & Pulmonary Rehab   ? Staff Present Birdie Sons, MPA, RN;Jessica [place], MA, RCEP, CCRP, CCET;Amanda Sommer, BA, ACSM CEP, Exercise Physiologist   ? Virtual Visit No   ? Medication changes reported     No   ? Fall or balance concerns reported    No   ? Warm-up and Cool-down Performed on first and last piece of equipment   ? Resistance Training Performed Yes   ? VAD Patient? No   ? PAD/SET Patient? No   ?  ? Pain Assessment  ? Currently in Pain? No/denies   ? ?  ?  ? ?  ? ? ? ? ? ?Social History  ? ?Tobacco Use  ?Smoking Status Former  ? Packs/day: 1.00  ? Years: 25.00  ? Pack years: 25.00  ? Types: Cigarettes  ? Quit date: 09/23/1994  ? Years since quitting: 27.4  ?Smokeless Tobacco Never  ? ? ?Goals Met:  ?Independence with exercise equipment ?Exercise tolerated well ?No report of concerns or symptoms today ?Strength training completed today ? ?Goals Unmet:  ?Not Applicable ? ?Comments: Pt able to follow exercise prescription today without complaint.  Will continue to monitor for progression. ? ? ? ?Dr. Emily Filbert is Medical Director for [place].  ?Dr. Ottie Glazier is Medical Director for [facility] Pulmonary Rehabilitation. ?

## 2022-02-14 ENCOUNTER — Other Ambulatory Visit: Payer: Self-pay

## 2022-02-14 DIAGNOSIS — Z955 Presence of coronary angioplasty implant and graft: Secondary | ICD-10-CM | POA: Diagnosis not present

## 2022-02-14 NOTE — Progress Notes (Signed)
Daily Session Note ? ?Patient Details  ?Name: Nicholas Knapp ?MRN: [id number] ?Date of Birth: 04/01/1956 ?Referring Provider:   ?Flowsheet Row Cardiac Rehab from 11/26/2021 in [facility] Cardiac and Pulmonary Rehab  ?Referring Provider Cloretta Ned MD  ? ?  ? ? ?Encounter Date: 02/14/2022 ? ?Check In: ? Session Check In - 02/14/22 0942   ? ?  ? Check-In  ? Supervising physician immediately available to respond to emergencies See telemetry face sheet for immediately available ER MD   ? Location [facility]-Cardiac & Pulmonary Rehab   ? Staff Present Birdie Sons, MPA, RN;Jessica [place], MA, RCEP, CCRP, [place], BS, ACSM CEP, Exercise Physiologist   ? Virtual Visit No   ? Medication changes reported     No   ? Fall or balance concerns reported    No   ? Warm-up and Cool-down Performed on first and last piece of equipment   ? Resistance Training Performed Yes   ? VAD Patient? No   ? PAD/SET Patient? No   ?  ? Pain Assessment  ? Currently in Pain? No/denies   ? ?  ?  ? ?  ? ? ? ? ? ?Social History  ? ?Tobacco Use  ?Smoking Status Former  ? Packs/day: 1.00  ? Years: 25.00  ? Pack years: 25.00  ? Types: Cigarettes  ? Quit date: 09/23/1994  ? Years since quitting: 27.4  ?Smokeless Tobacco Never  ? ? ?Goals Met:  ?Independence with exercise equipment ?Exercise tolerated well ?No report of concerns or symptoms today ?Strength training completed today ? ?Goals Unmet:  ?Not Applicable ? ?Comments: Pt able to follow exercise prescription today without complaint.  Will continue to monitor for progression. ? ? ? ?Dr. Emily Filbert is Medical Director for [place].  ?Dr. Ottie Glazier is Medical Director for [facility] Pulmonary Rehabilitation. ?

## 2022-02-16 IMAGING — MR MR LUMBAR SPINE W/O CM
5 series · 31 of 48 positions shown · non-contrast
Comparison: MRI lumbar spine dated July 18, 2020.

CLINICAL DATA: Severe right buttock and leg pain. Unable to sit.
Recent epidural injection.

EXAM:
MRI LUMBAR SPINE WITHOUT CONTRAST
TECHNIQUE: Multiplanar, multisequence MR imaging of the lumbar spine was
performed. No intravenous contrast was administered.

[Series 5: T2 · sagittal · 4.0mm · 0.81mm/px · 6 of 17 slices shown (1 of 2)]
[im 1/17]
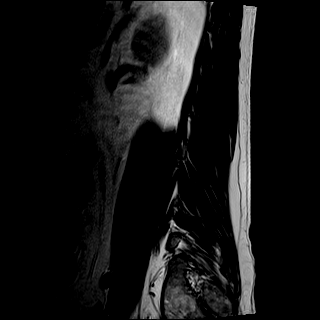
[im 4/17]
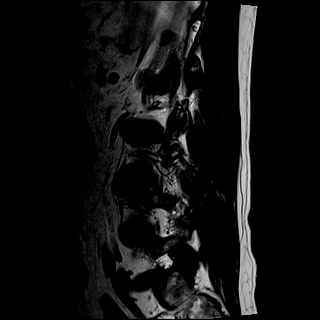
[im 7/17]
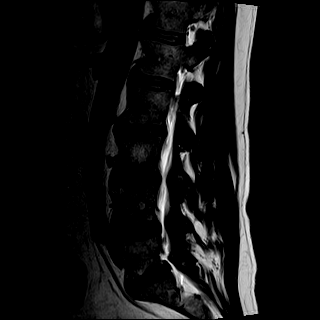
[im 10/17]
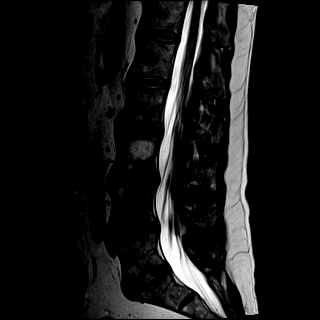
[im 13/17]
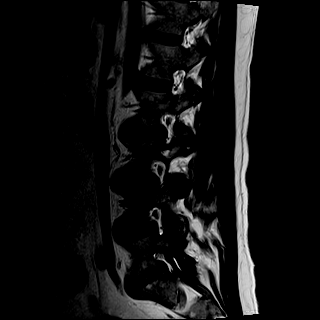
[im 17/17]
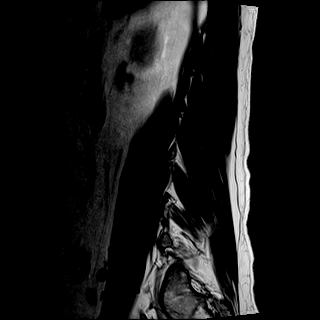

[Series 6: T1 · sagittal · 4.0mm · 0.81mm/px · 6 of 17 slices shown (1 of 2)]
[im 1/17]
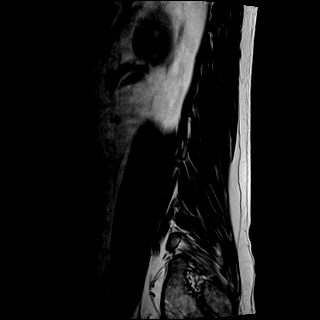
[im 4/17]
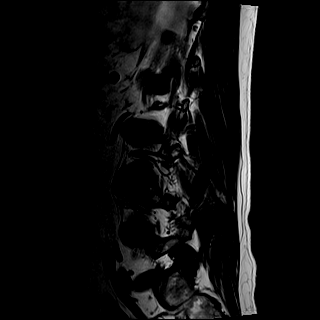
[im 7/17]
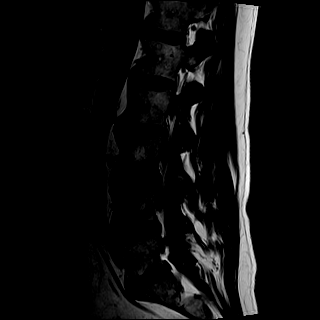
[im 10/17]
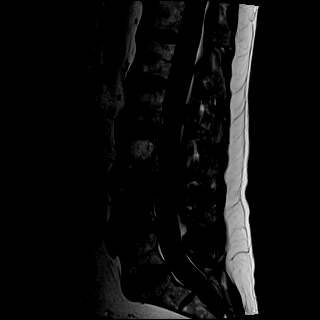
[im 13/17]
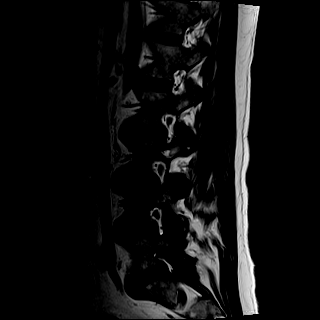
[im 17/17]
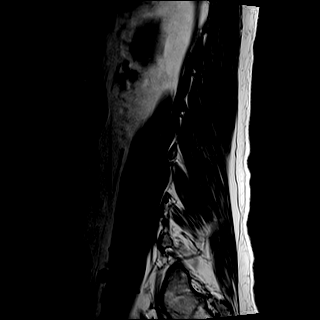

[Series 7: STIR · sagittal · 4.0mm · 0.41mm/px · 1 of 17 slices shown]
[im 1/17]
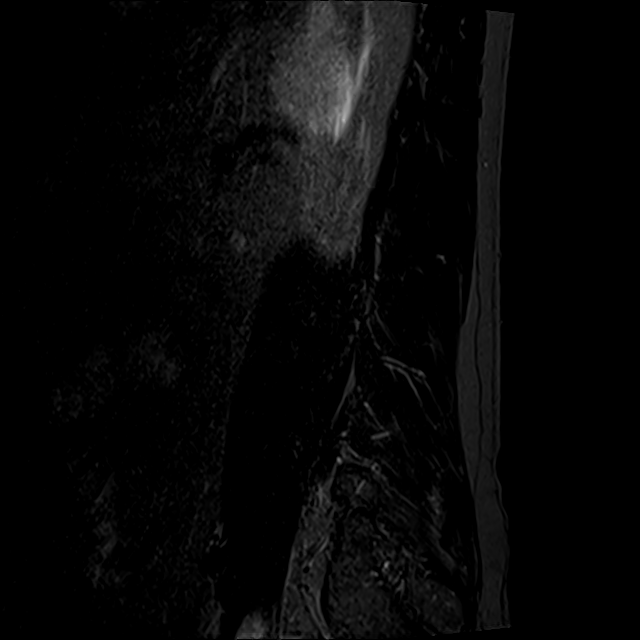

[Series 8: T2 · axial · 4.0mm · 0.78mm/px · z∈[-103,+134]mm · 9 of 41 slices shown (2 of 2)]
[im 1/41]
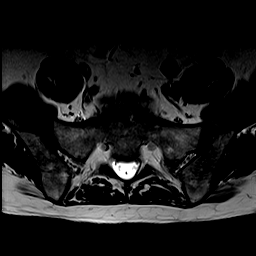
[im 6/41]
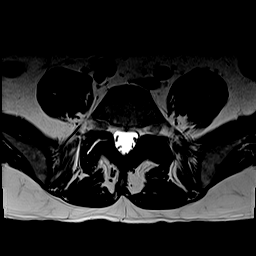
[im 12/41]
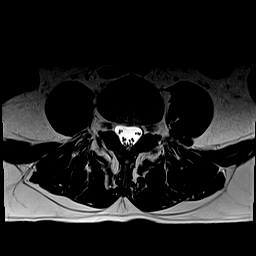
[im 18/41]
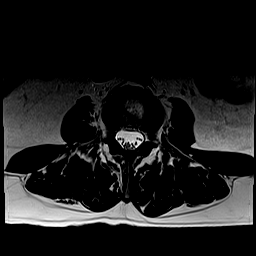
[im 21/41]
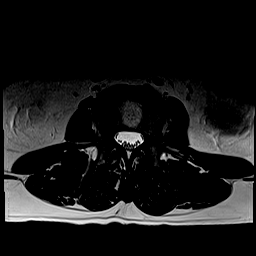
[im 23/41]
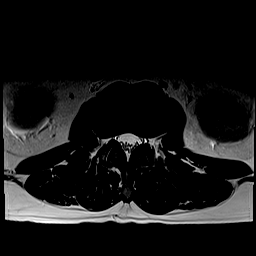
[im 29/41]
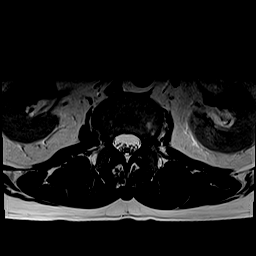
[im 35/41]
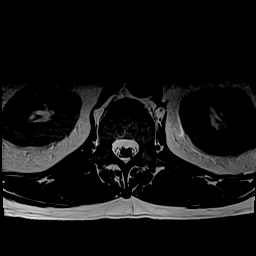
[im 41/41]
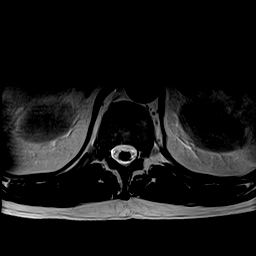

[Series 9: T1 · axial · 4.0mm · 0.39mm/px · z∈[-103,+134]mm · 9 of 41 slices shown (2 of 2)]
[im 1/41]
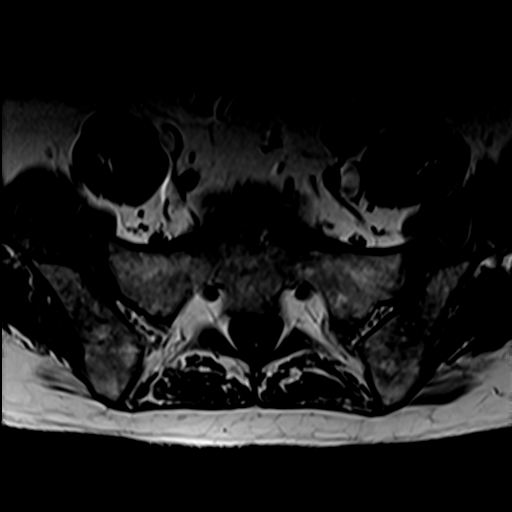
[im 6/41]
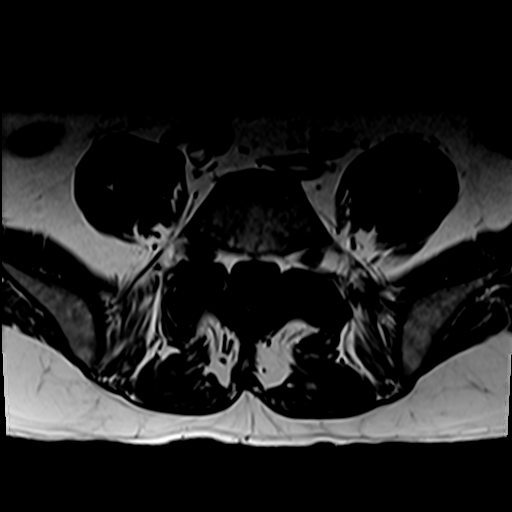
[im 12/41]
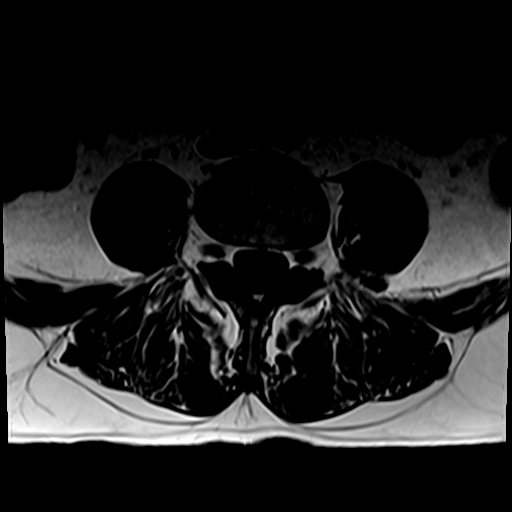
[im 18/41]
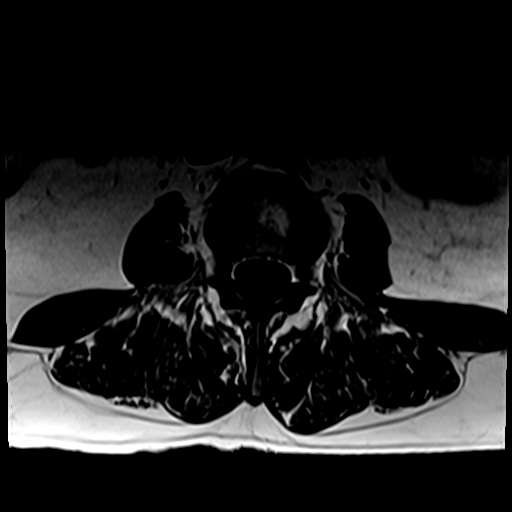
[im 21/41]
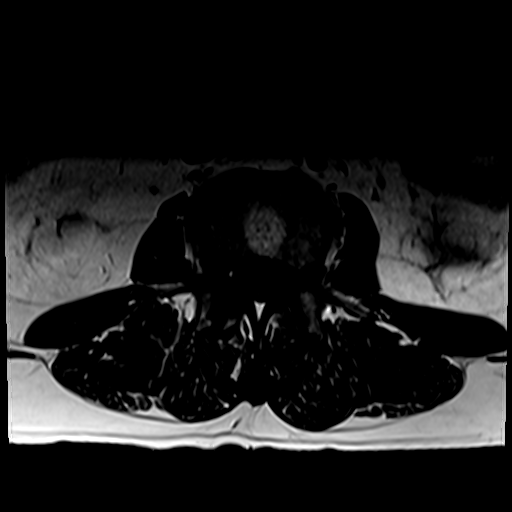
[im 23/41]
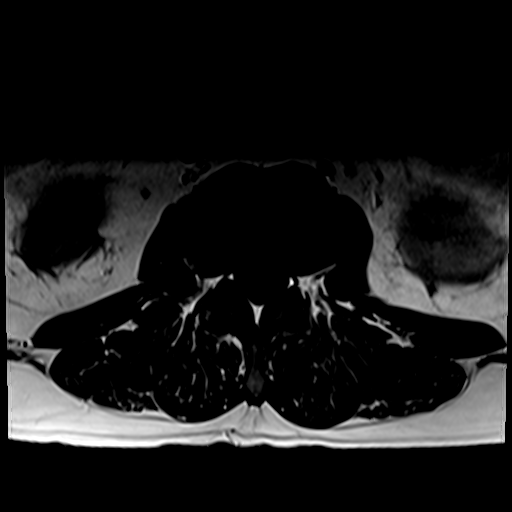
[im 29/41]
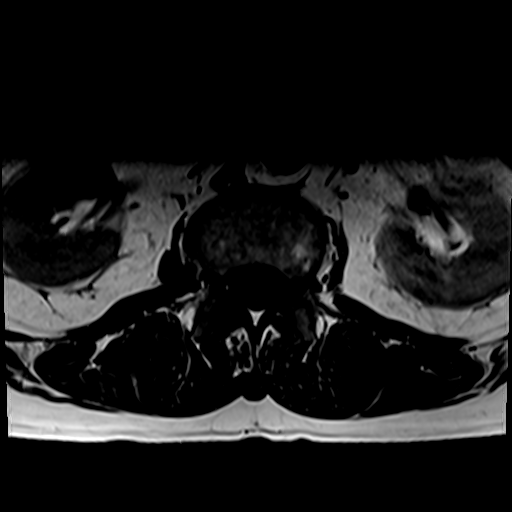
[im 35/41]
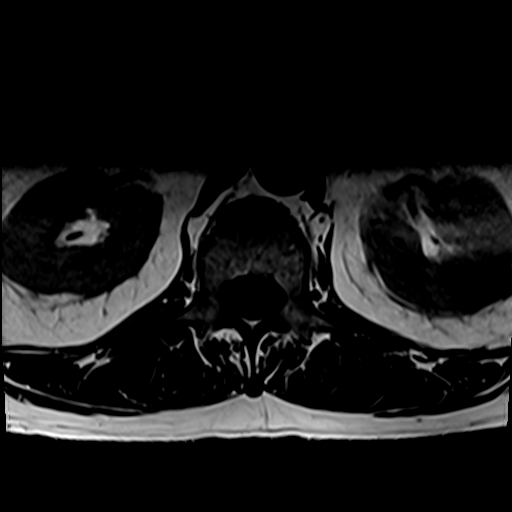
[im 41/41]
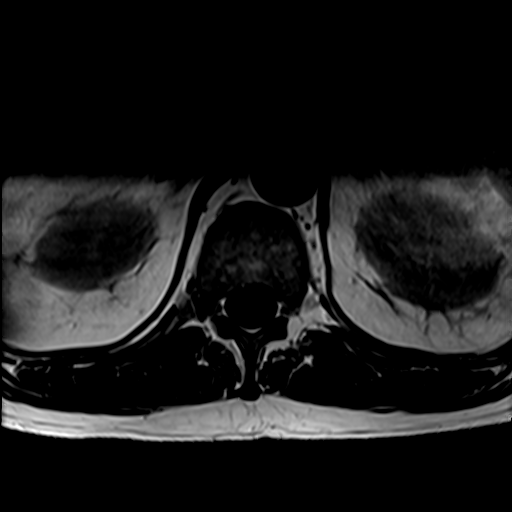

[31 of 48 positions shown; findings below may reference images not displayed]

FINDINGS: Segmentation:  Standard.

Alignment:  Physiologic.

Vertebrae: No fracture, evidence of discitis, or suspicious bone
lesion. Scattered vertebral body hemangiomas again noted.

Conus medullaris and cauda equina: Conus extends to the L2 level.
Conus and cauda equina appear normal.

Paraspinal and other soft tissues: Negative.

Disc levels:

T12-L1:  Negative.

L1-L2:  Negative.

L2-L3: Unchanged small right extraforaminal/far lateral disc
protrusion encroaching on the exiting right L2 nerve root. Unchanged
mild right facet arthropathy. No stenosis.

L3-L4: Unchanged mild disc bulging and bilateral facet arthropathy.
Unchanged mild right lateral recess stenosis. No spinal canal or
neuroforaminal stenosis.

L4-L5: Unchanged mild disc bulging and moderate bilateral facet
arthropathy. Unchanged mild bilateral lateral recess stenosis. No
spinal canal or neuroforaminal stenosis.

L5-S1: Unchanged mild disc bulging with superimposed small right
extraforaminal disc protrusion contacting the exiting right L5 nerve
root. Unchanged severe bilateral facet arthropathy. Unchanged
moderate right and mild left neuroforaminal stenosis. No spinal
canal stenosis.
IMPRESSION: 1. No significant interval change. Unchanged small right
extraforaminal disc protrusion at L5-S1 contacting the exiting right
L5 nerve root. Unchanged moderate right and mild left neuroforaminal
stenosis at this level.
2. Unchanged small right extraforaminal disc protrusion at L2-L3
encroaching on the exiting right L2 nerve root.
3. Unchanged mild lateral recess stenosis at L3-L4 and L4-L5.

## 2022-02-19 ENCOUNTER — Other Ambulatory Visit: Payer: Self-pay

## 2022-02-19 DIAGNOSIS — Z955 Presence of coronary angioplasty implant and graft: Secondary | ICD-10-CM

## 2022-02-19 NOTE — Progress Notes (Signed)
Daily Session Note ? ?Patient Details  ?Name: Nicholas Knapp ?MRN: [id number] ?Date of Birth: [birth year] ?Referring Provider:   ?Flowsheet Row Cardiac Rehab from 11/26/2021 in [facility] Cardiac and Pulmonary Rehab  ?Referring Provider Cloretta Ned MD  ? ?  ? ? ?Encounter Date: 02/19/2022 ? ?Check In: ? Session Check In - 02/19/22 0936   ? ?  ? Check-In  ? Supervising physician immediately available to respond to emergencies See telemetry face sheet for immediately available ER MD   ? Location [facility]-Cardiac & Pulmonary Rehab   ? Staff Present Birdie Sons, MPA, RN;Jessica [place], MA, RCEP, CCRP, CCET;Amanda Sommer, BA, ACSM CEP, Exercise Physiologist   ? Virtual Visit No   ? Medication changes reported     Yes   ? Comments cut losartan to 70m   ? Fall or balance concerns reported    No   ? Warm-up and Cool-down Performed on first and last piece of equipment   ? Resistance Training Performed Yes   ? VAD Patient? No   ? PAD/SET Patient? No   ?  ? Pain Assessment  ? Currently in Pain? No/denies   ? ?  ?  ? ?  ? ? ? ? ? ?Social History  ? ?Tobacco Use  ?Smoking Status Former  ? Packs/day: 1.00  ? Years: 25.00  ? Pack years: 25.00  ? Types: Cigarettes  ? Quit date: 09/23/1994  ? Years since quitting: 27.4  ?Smokeless Tobacco Never  ? ? ?Goals Met:  ?Independence with exercise equipment ?Exercise tolerated well ?No report of concerns or symptoms today ?Strength training completed today ? ?Goals Unmet:  ?Not Applicable ? ?Comments: Pt able to follow exercise prescription today without complaint.  Will continue to monitor for progression. ? ? ? ?Dr. MEmily Filbertis Medical Director for [place]  ?Dr. FOttie [facility]ieris [facility]. ?

## 2022-02-21 ENCOUNTER — Other Ambulatory Visit: Payer: Self-pay

## 2022-02-21 DIAGNOSIS — Z955 Presence of coronary angioplasty implant and graft: Secondary | ICD-10-CM | POA: Diagnosis not present

## 2022-02-21 NOTE — Progress Notes (Signed)
Daily Session Note ? ?Patient Details  ?Name: Nicholas Knapp ?MRN: [id number] ?Date of Birth: February 17, 1956 ?Referring Provider:   ?Flowsheet Row Cardiac Rehab from 11/26/2021 in [facility] Cardiac and Pulmonary Rehab  ?Referring Provider Cloretta Ned MD  ? ?  ? ? ?Encounter Date: 02/21/2022 ? ?Check In: ? Session Check In - 02/21/22 0952   ? ?  ? Check-In  ? Supervising physician immediately available to respond to emergencies See telemetry face sheet for immediately available ER MD   ? Location [facility]-Cardiac & Pulmonary Rehab   ? Staff Present Birdie Sons, MPA, RN;Aahana Elza Amedeo Plenty, BS, ACSM CEP, Exercise Physiologist;Jessica Luan Pulling, MA, RCEP, CCRP, CCET   ? Virtual Visit No   ? Medication changes reported     Yes   ? Comments no longer taking losartan   ? Fall or balance concerns reported    No   ? Warm-up and Cool-down Performed on first and last piece of equipment   ? Resistance Training Performed Yes   ? VAD Patient? No   ? PAD/SET Patient? No   ?  ? Pain Assessment  ? Currently in Pain? No/denies   ? ?  ?  ? ?  ? ? ? ? ? ?Social History  ? ?Tobacco Use  ?Smoking Status Former  ? Packs/day: 1.00  ? Years: 25.00  ? Pack years: 25.00  ? Types: Cigarettes  ? Quit date: 09/23/1994  ? Years since quitting: 27.4  ?Smokeless Tobacco Never  ? ? ?Goals Met:  ?Independence with exercise equipment ?Exercise tolerated well ?No report of concerns or symptoms today ?Strength training completed today ? ?Goals Unmet:  ?Not Applicable ? ?Comments: Pt able to follow exercise prescription today without complaint.  Will continue to monitor for progression. ? ? ?Dr. Emily Filbert is Medical Director for [place].  ?Dr. Ottie Glazier is Medical Director for [facility] Pulmonary Rehabilitation. ?

## 2022-02-26 ENCOUNTER — Other Ambulatory Visit: Payer: Self-pay

## 2022-02-26 DIAGNOSIS — Z955 Presence of coronary angioplasty implant and graft: Secondary | ICD-10-CM

## 2022-02-26 NOTE — Progress Notes (Signed)
Daily Session Note ? ?Patient Details  ?Name: Nicholas Knapp ?MRN: [id number] ?Date of Birth: 06-15-1956 ?Referring Provider:   ?Flowsheet Row Cardiac Rehab from 11/26/2021 in [facility] Cardiac and Pulmonary Rehab  ?Referring Provider Cloretta Ned MD  ? ?  ? ? ?Encounter Date: 02/26/2022 ? ?Check In: ? Session Check In - 02/26/22 0940   ? ?  ? Check-In  ? Supervising physician immediately available to respond to emergencies See telemetry face sheet for immediately available ER MD   ? Location [facility]-Cardiac & Pulmonary Rehab   ? Staff Present Birdie Sons, MPA, RN;Amanda Sommer, BA, ACSM CEP, Exercise Physiologist;Jessica Luan Pulling, MA, RCEP, CCRP, CCET   ? Virtual Visit No   ? Medication changes reported     No   ? Fall or balance concerns reported    No   ? Warm-up and Cool-down Performed on first and last piece of equipment   ? Resistance Training Performed Yes   ? VAD Patient? No   ? PAD/SET Patient? No   ?  ? Pain Assessment  ? Currently in Pain? No/denies   ? ?  ?  ? ?  ? ? ? ? ? ?Social History  ? ?Tobacco Use  ?Smoking Status Former  ? Packs/day: 1.00  ? Years: 25.00  ? Pack years: 25.00  ? Types: Cigarettes  ? Quit date: 09/23/1994  ? Years since quitting: 27.4  ?Smokeless Tobacco Never  ? ? ?Goals Met:  ?Independence with exercise equipment ?Exercise tolerated well ?No report of concerns or symptoms today ?Strength training completed today ? ?Goals Unmet:  ?Not Applicable ? ?Comments: Pt able to follow exercise prescription today without complaint.  Will continue to monitor for progression. ? ? ? ?Dr. Emily Filbert is Medical Director for [place].  ?Dr. Ottie Glazier is Medical Director for [facility] Pulmonary Rehabilitation. ?

## 2022-02-27 ENCOUNTER — Encounter: Payer: Self-pay | Admitting: *Deleted

## 2022-02-27 DIAGNOSIS — Z955 Presence of coronary angioplasty implant and graft: Secondary | ICD-10-CM

## 2022-02-27 NOTE — Progress Notes (Signed)
Cardiac Individual Treatment Plan ? ?Patient Details  ?Name: Nicholas Knapp ?MRN: [id number] ?Date of Birth: [birth year] ?Referring Provider:   ?Flowsheet Row Cardiac Rehab from 11/26/2021 in [facility] Cardiac and Pulmonary Rehab  ?Referring Provider Youlanda Mighty MD  ? ?  ? ? ?Initial Encounter Date:  ?Flowsheet Row Cardiac Rehab from 11/26/2021 in [facility] Cardiac and Pulmonary Rehab  ?Date 11/26/21  ? ?  ? ? ?Visit Diagnosis: Status post coronary artery stent placement ? ?Patient's Home Medications on Admission: ? ?Current Outpatient Medications:  ?  aspirin 81 MG tablet, Take 81 mg by mouth every evening. , Disp: , Rfl:  ?  azelastine (ASTELIN) 0.1 % nasal spray, Place into the nose., Disp: , Rfl:  ?  CALCIUM POLYCARBOPHIL PO, Take 1 tablet by mouth daily., Disp: , Rfl:  ?  clopidogrel (PLAVIX) 75 MG tablet, Take 75 mg by mouth daily., Disp: , Rfl:  ?  clopidogrel (PLAVIX) 75 MG tablet, Take 1 tablet by mouth daily. (Patient not taking: Reported on 11/08/2021), Disp: , Rfl:  ?  diphenhydrAMINE (BENADRYL) 25 MG tablet, Take 25 mg by mouth daily as needed for allergies., Disp: , Rfl:  ?  DULoxetine (CYMBALTA) 60 MG capsule, Take 60 mg by mouth daily., Disp: , Rfl:  ?  DULoxetine (CYMBALTA) 60 MG capsule, Take 1 capsule by mouth daily. (Patient not taking: Reported on 11/08/2021), Disp: , Rfl:  ?  Empagliflozin (JARDIANCE PO), Take 20 mg by mouth daily., Disp: , Rfl:  ?  EPINEPHrine 0.3 mg/0.3 mL IJ SOAJ injection, Inject into the muscle once., Disp: , Rfl:  ?  gabapentin (NEURONTIN) 600 MG tablet, Take 1 tablet (600 mg total) by mouth every 8 (eight) hours., Disp: 90 tablet, Rfl: 2 ?  gabapentin (NEURONTIN) 600 MG tablet, 2 (two) times daily (Patient not taking: Reported on 11/08/2021), Disp: , Rfl:  ?  glimepiride (AMARYL) 2 MG tablet, Take 2 mg by mouth daily with breakfast., Disp: , Rfl:  ?  glucose blood test strip, 1 each by Other route as needed for other. Use as instructed, Disp: , Rfl:  ?  JARDIANCE 25 MG TABS  tablet, Take 25 mg by mouth daily., Disp: , Rfl:  ?  losartan (COZAAR) 100 MG tablet, Take 100 mg by mouth daily., Disp: , Rfl:  ?  losartan (COZAAR) 25 MG tablet, Take 25 mg by mouth every evening.  (Patient not taking: Reported on 11/08/2021), Disp: , Rfl:  ?  metoprolol succinate (TOPROL-XL) 25 MG 24 hr tablet, Take 25 mg by mouth every evening. , Disp: , Rfl:  ?  nitroGLYCERIN (NITROSTAT) 0.4 MG SL tablet, Place 0.4 mg under the tongue every 5 (five) minutes as needed for chest pain., Disp: , Rfl:  ?  pantoprazole (PROTONIX) 40 MG tablet, Take 40 mg by mouth daily. , Disp: , Rfl:  ?  pantoprazole (PROTONIX) 40 MG tablet, Take 1 tablet by mouth at bedtime. (Patient not taking: Reported on 11/08/2021), Disp: , Rfl:  ?  polycarbophil (FIBERCON) 625 MG tablet, Take 625 mg by mouth every evening., Disp: , Rfl:  ?  rosuvastatin (CRESTOR) 20 MG tablet, Take 20 mg by mouth every evening. , Disp: , Rfl:  ?  spironolactone (ALDACTONE) 25 MG tablet, Take by mouth., Disp: , Rfl:  ?  STUDY - ASPIRE - aspirin 81 mg or placebo tablet (PI-Sethi), Take 1 tablet by mouth every evening., Disp: , Rfl:  ?  traZODone (DESYREL) 50 MG tablet, Take 50 mg by mouth at bedtime as  needed for sleep., Disp: , Rfl:  ? ?Past Medical History: ?Past Medical History:  ?Diagnosis Date  ? Allergic state   ? Anxiety   ? Asthma   ? Coronary artery disease   ? Depression   ? Diabetes mellitus without complication (HCC)   ? Diverticulosis   ? GERD (gastroesophageal reflux disease)   ? Hyperlipemia   ? Hypertension   ? Midsystolic murmur   ? Myocardial infarction [facility]) 1995 and 2008  ? Prostatitis   ? Proteinuria   ? Sleep apnea   ? ? ?Tobacco Use: ?Social History  ? ?Tobacco Use  ?Smoking Status Former  ? Packs/day: 1.00  ? Years: 25.00  ? Pack years: 25.00  ? Types: Cigarettes  ? Quit date: 09/23/1994  ? Years since quitting: 27.4  ?Smokeless Tobacco Never  ? ? ?Labs: ?Review Flowsheet   ? ?    ? View : No data to display.  ?  ?  ?  ?  ?  ? ? ? ?Exercise  Target Goals: ?Exercise Program Goal: ?Individual exercise prescription set using results from initial 6 min walk test and THRR while considering  patient?s activity barriers and safety.  ? ?Exercise Prescription Goal: ?Initial exercise prescription builds to 30-45 minutes a day of aerobic activity, 2-3 days per week.  Home exercise guidelines will be given to patient during program as part of exercise prescription that the participant will acknowledge. ? ? ?Education: Aerobic Exercise: ?- Group verbal and visual presentation on the components of exercise prescription. Introduces F.I.T.T principle from ACSM for exercise prescriptions.  Reviews F.I.T.T. principles of aerobic exercise including progression. Written material given at graduation. ?Flowsheet Row Cardiac Rehab from 02/14/2022 in [facility] Cardiac and Pulmonary Rehab  ?Education need identified 11/26/21  ? ?  ? ? ?Education: Resistance Exercise: ?- Group verbal and visual presentation on the components of exercise prescription. Introduces F.I.T.T principle from ACSM for exercise prescriptions  Reviews F.I.T.T. principles of resistance exercise including progression. Written material given at graduation. ?Flowsheet Row Cardiac Rehab from 02/14/2022 in [facility] Cardiac and Pulmonary Rehab  ?Date 12/13/21  ?Educator [facility]  ?Instruction Review Code 1- Verbalizes Understanding  ? ?  ? ?  ?Education: Exercise & Equipment Safety: ?- Individual verbal instruction and demonstration of equipment use and safety with use of the equipment. ?Flowsheet Row Cardiac Rehab from 02/14/2022 in [facility] Cardiac and Pulmonary Rehab  ?Date 11/08/21  ?Educator [employer]  ?Instruction Review Code 1- Verbalizes Understanding  ? ?  ? ? ?Education: Exercise Physiology & General Exercise Guidelines: ?- Group verbal and written instruction with models to review the exercise physiology of the cardiovascular system and associated critical values. Provides general exercise guidelines with specific guidelines to those  with heart or lung disease.  ? ? ?Education: Flexibility, Balance, Mind/Body Relaxation: ?- Group verbal and visual presentation with interactive activity on the components of exercise prescription. Introduces F.I.T.T principle from ACSM for exercise prescriptions. Reviews F.I.T.T. principles of flexibility and balance exercise training including progression. Also discusses the mind body connection.  Reviews various relaxation techniques to help reduce and manage stress (i.e. Deep breathing, progressive muscle relaxation, and visualization). Balance handout provided to take home. Written material given at graduation. ? ? ?Activity Barriers & Risk Stratification: ? Activity Barriers & Cardiac Risk Stratification - 11/26/21 1111   ? ?  ? Activity Barriers & Cardiac Risk Stratification  ? Activity Barriers Muscular Weakness;Deconditioning;Shortness of Breath;Back Problems;Neck/Spine Problems;Balance Concerns   chronic back pain  ? Cardiac Risk Stratification High   ? ?  ?  ? ?  ? ? ?  6 Minute Walk: ? 6 Minute Walk   ? ? Row Name 11/26/21 1110  ?  ?  ?  ? 6 Minute Walk  ? Phase Initial    ? Distance 1275 feet    ? Walk Time 6 minutes    ? # of Rest Breaks 0    ? MPH 2.41    ? METS 3    ? RPE 11    ? Perceived Dyspnea  1    ? VO2 Peak 10.53    ? Symptoms Yes (comment)    ? Comments SOB    ? Resting HR 66 bpm    ? Resting BP 108/64    ? Resting Oxygen Saturation  97 %    ? Exercise Oxygen Saturation  during 6 min walk 96 %    ? Max Ex. HR 128 bpm    ? Max Ex. BP 122/68    ? 2 Minute Post BP 112/60    ? ?  ?  ? ?  ? ? ?Oxygen Initial Assessment: ? ? ?Oxygen Re-Evaluation: ? ? ?Oxygen Discharge (Final Oxygen Re-Evaluation): ? ? ?Initial Exercise Prescription: ? Initial Exercise Prescription - 11/26/21 1100   ? ?  ? Date of Initial Exercise RX and Referring Provider  ? Date 11/26/21   ? Referring Provider Youlanda Mighty MD   ?  ? Oxygen  ? Maintain Oxygen Saturation 88% or higher   ?  ? Treadmill  ? MPH 2.2   ? Grade 0.5    ? Minutes 15   ? METs 2.84   ?  ? Recumbant Elliptical  ? Level 1   ? RPM 50   ? Minutes 15   ? METs 2.5   ?  ? REL-XR  ? Level 2   ? Speed 50   ? Minutes 15   ? METs 2.5   ?  ? Prescription Details  ? Fr

## 2022-02-28 ENCOUNTER — Other Ambulatory Visit: Payer: Self-pay

## 2022-02-28 DIAGNOSIS — Z955 Presence of coronary angioplasty implant and graft: Secondary | ICD-10-CM | POA: Diagnosis not present

## 2022-02-28 NOTE — Progress Notes (Signed)
Daily Session Note ? ?Patient Details  ?Name: Nicholas Knapp ?MRN: [id number] ?Date of Birth: 05/09/1956 ?Referring Provider:   ?Flowsheet Row Cardiac Rehab from 11/26/2021 in [facility] Cardiac and Pulmonary Rehab  ?Referring Provider Cloretta Ned MD  ? ?  ? ? ?Encounter Date: 02/28/2022 ? ?Check In: ? Session Check In - 02/28/22 0944   ? ?  ? Check-In  ? Supervising physician immediately available to respond to emergencies See telemetry face sheet for immediately available ER MD   ? Location [facility]-Cardiac & Pulmonary Rehab   ? Staff Present Birdie Sons, MPA, Mauricia Area, BS, ACSM CEP, Exercise Physiologist;Kara Eliezer Bottom, MS, ASCM CEP, Exercise Physiologist   ? Virtual Visit No   ? Medication changes reported     No   ? Fall or balance concerns reported    No   ? Warm-up and Cool-down Performed on first and last piece of equipment   ? Resistance Training Performed Yes   ? VAD Patient? No   ? PAD/SET Patient? No   ?  ? Pain Assessment  ? Currently in Pain? No/denies   ? ?  ?  ? ?  ? ? ? ? ? ?Social History  ? ?Tobacco Use  ?Smoking Status Former  ? Packs/day: 1.00  ? Years: 25.00  ? Pack years: 25.00  ? Types: Cigarettes  ? Quit date: 09/23/1994  ? Years since quitting: 27.4  ?Smokeless Tobacco Never  ? ? ?Goals Met:  ?Independence with exercise equipment ?Exercise tolerated well ?No report of concerns or symptoms today ?Strength training completed today ? ?Goals Unmet:  ?Not Applicable ? ?Comments: Pt able to follow exercise prescription today without complaint.  Will continue to monitor for progression. ? ? ? ?Dr. Emily Filbert is Medical Director for [place].  ?Dr. Ottie Glazier is Medical Director for [facility] Pulmonary Rehabilitation. ?

## 2022-03-05 ENCOUNTER — Other Ambulatory Visit: Payer: Self-pay

## 2022-03-05 DIAGNOSIS — Z955 Presence of coronary angioplasty implant and graft: Secondary | ICD-10-CM

## 2022-03-05 NOTE — Progress Notes (Signed)
Daily Session Note ? ?Patient Details  ?Name: Nicholas Knapp ?MRN: [id number] ?Date of Birth: Feb 05, 1956 ?Referring Provider:   ?Flowsheet Row Cardiac Rehab from 11/26/2021 in [facility] Cardiac and Pulmonary Rehab  ?Referring Provider Cloretta Ned MD  ? ?  ? ? ?Encounter Date: 03/05/2022 ? ?Check In: ? Session Check In - 03/05/22 0934   ? ?  ? Check-In  ? Supervising physician immediately available to respond to emergencies See telemetry face sheet for immediately available ER MD   ? Location [facility]-Cardiac & Pulmonary Rehab   ? Staff Present Birdie Sons, MPA, RN;Amanda Sommer, BA, ACSM CEP, Exercise Physiologist;Jessica Luan Pulling, MA, RCEP, CCRP, CCET   ? Virtual Visit No   ? Medication changes reported     No   ? Fall or balance concerns reported    No   ? Warm-up and Cool-down Performed on first and last piece of equipment   ? Resistance Training Performed Yes   ? VAD Patient? No   ? PAD/SET Patient? No   ?  ? Pain Assessment  ? Currently in Pain? No/denies   ? ?  ?  ? ?  ? ? ? ? ? ?Social History  ? ?Tobacco Use  ?Smoking Status Former  ? Packs/day: 1.00  ? Years: 25.00  ? Pack years: 25.00  ? Types: Cigarettes  ? Quit date: 09/23/1994  ? Years since quitting: 27.4  ?Smokeless Tobacco Never  ? ? ?Goals Met:  ?Independence with exercise equipment ?Exercise tolerated well ?No report of concerns or symptoms today ?Strength training completed today ? ?Goals Unmet:  ?Not Applicable ? ?Comments: Pt able to follow exercise prescription today without complaint.  Will continue to monitor for progression. ? ? ? ?Dr. Emily Filbert is Medical Director for [place].  ?Dr. Ottie Glazier is Medical Director for [facility] Pulmonary Rehabilitation. ?

## 2022-03-07 DIAGNOSIS — Z955 Presence of coronary angioplasty implant and graft: Secondary | ICD-10-CM | POA: Diagnosis not present

## 2022-03-07 NOTE — Progress Notes (Signed)
Daily Session Note ? ?Patient Details  ?Name: Nicholas Knapp ?MRN: [id number] ?Date of Birth: 1956/04/07 ?Referring Provider:   ?Flowsheet Row Cardiac Rehab from 11/26/2021 in [facility] Cardiac and Pulmonary Rehab  ?Referring Provider Cloretta Ned MD  ? ?  ? ? ?Encounter Date: 03/07/2022 ? ?Check In: ? Session Check In - 03/07/22 0942   ? ?  ? Check-In  ? Supervising physician immediately available to respond to emergencies See telemetry face sheet for immediately available ER MD   ? Location [facility]-Cardiac & Pulmonary Rehab   ? Staff Present Birdie Sons, MPA, RN;Amanda Sommer, BA, ACSM CEP, Exercise Physiologist;Melissa Tripoli, RDN, Tawanna Solo, MS, ASCM CEP, Exercise Physiologist   ? Virtual Visit No   ? Medication changes reported     No   ? Fall or balance concerns reported    No   ? Warm-up and Cool-down Performed on first and last piece of equipment   ? Resistance Training Performed Yes   ? VAD Patient? No   ? PAD/SET Patient? No   ?  ? Pain Assessment  ? Currently in Pain? No/denies   ? ?  ?  ? ?  ? ? ? ? ? ?Social History  ? ?Tobacco Use  ?Smoking Status Former  ? Packs/day: 1.00  ? Years: 25.00  ? Pack years: 25.00  ? Types: Cigarettes  ? Quit date: 09/23/1994  ? Years since quitting: 27.4  ?Smokeless Tobacco Never  ? ? ?Goals Met:  ?Independence with exercise equipment ?Exercise tolerated well ?No report of concerns or symptoms today ?Strength training completed today ? ?Goals Unmet:  ?Not Applicable ? ?Comments: Pt able to follow exercise prescription today without complaint.  Will continue to monitor for progression. ? ? ? ?Dr. Emily Filbert is Medical Director for [place].  ?Dr. Ottie Glazier is Medical Director for [facility] Pulmonary Rehabilitation. ?

## 2022-03-12 ENCOUNTER — Encounter: Payer: Medicare Other | Attending: Cardiovascular Disease

## 2022-03-12 DIAGNOSIS — Z955 Presence of coronary angioplasty implant and graft: Secondary | ICD-10-CM | POA: Insufficient documentation

## 2022-03-12 DIAGNOSIS — Z48812 Encounter for surgical aftercare following surgery on the circulatory system: Secondary | ICD-10-CM | POA: Diagnosis present

## 2022-03-12 NOTE — Progress Notes (Signed)
Daily Session Note ? ?Patient Details  ?Name: Nicholas Knapp ?MRN: [id number] ?Date of Birth: [birth year] ?Referring Provider:   ?Flowsheet Row Cardiac Rehab from 11/26/2021 in [facility] Cardiac and Pulmonary Rehab  ?Referring Provider Nicholas Ned MD  ? ?  ? ? ?Encounter Date: 03/12/2022 ? ?Check In: ? Session Check In - 03/12/22 0942   ? ?  ? Check-In  ? Supervising physician immediately available to respond to emergencies See telemetry face sheet for immediately available ER MD   ? Location [facility]-Cardiac & Pulmonary Rehab   ? Staff Present Nicholas Knapp, MPA, RN;Nicholas Knapp, BA, ACSM CEP, Exercise Physiologist;Nicholas Luan Pulling, MA, RCEP, CCRP, CCET   ? Virtual Visit No   ? Medication changes reported     No   ? Fall or balance concerns reported    No   ? Warm-up and Cool-down Performed on first and last piece of equipment   ? Resistance Training Performed Yes   ? VAD Patient? No   ? PAD/SET Patient? No   ?  ? Pain Assessment  ? Currently in Pain? No/denies   ? ?  ?  ? ?  ? ? ? ? ? ?Social History  ? ?Tobacco Use  ?Smoking Status Former  ? Packs/day: 1.00  ? Years: 25.00  ? Pack years: 25.00  ? Types: Cigarettes  ? Quit date: 09/23/1994  ? Years since quitting: 27.4  ?Smokeless Tobacco Never  ? ? ?Goals Met:  ?Independence with exercise equipment ?Exercise tolerated well ?No report of concerns or symptoms today ?Strength training completed today ? ?Goals Unmet:  ?Not Applicable ? ?Comments: Pt able to follow exercise prescription today without complaint.  Will continue to monitor for progression. ? ? ? ?Dr. Emily Knapp is Medical Director for [name].  ?Dr. Ottie Knapp is Medical Director for [name] Pulmonary Rehabilitation. ?

## 2022-03-14 DIAGNOSIS — Z48812 Encounter for surgical aftercare following surgery on the circulatory system: Secondary | ICD-10-CM | POA: Diagnosis not present

## 2022-03-14 DIAGNOSIS — Z955 Presence of coronary angioplasty implant and graft: Secondary | ICD-10-CM

## 2022-03-14 NOTE — Progress Notes (Signed)
Daily Session Note ? ?Patient Details  ?Name: Nicholas Knapp ?MRN: [id number] ?Date of Birth: 1956-07-06 ?Referring Provider:   ?Flowsheet Row Cardiac Rehab from 11/26/2021 in [facility] Cardiac and Pulmonary Rehab  ?Referring Provider Cloretta Ned MD  ? ?  ? ? ?Encounter Date: 03/14/2022 ? ?Check In: ? Session Check In - 03/14/22 0944   ? ?  ? Check-In  ? Supervising physician immediately available to respond to emergencies See telemetry face sheet for immediately available ER MD   ? Location [facility]-Cardiac & Pulmonary Rehab   ? Staff Present Birdie Sons, MPA, RN;Jessica [place], MA, RCEP, CCRP, CCET;Amanda Sommer, BA, ACSM CEP, Exercise Physiologist;Burlie Cajamarca [place], BS, ACSM CEP, Exercise Physiologist   ? Virtual Visit No   ? Medication changes reported     No   ? Fall or balance concerns reported    No   ? Warm-up and Cool-down Performed on first and last piece of equipment   ? Resistance Training Performed Yes   ? VAD Patient? No   ? PAD/SET Patient? No   ?  ? Pain Assessment  ? Currently in Pain? No/denies   ? ?  ?  ? ?  ? ? ? ? ? ?Social History  ? ?Tobacco Use  ?Smoking Status Former  ? Packs/day: 1.00  ? Years: 25.00  ? Pack years: 25.00  ? Types: Cigarettes  ? Quit date: 09/23/1994  ? Years since quitting: 27.4  ?Smokeless Tobacco Never  ? ? ?Goals Met:  ?Independence with exercise equipment ?Exercise tolerated well ?No report of concerns or symptoms today ?Strength training completed today ? ?Goals Unmet:  ?Not Applicable ? ?Comments: Pt able to follow exercise prescription today without complaint.  Will continue to monitor for progression. ? ? ? ?Dr. Emily Filbert is Medical Director for [place].  ?Dr. Ottie Glazier is Medical Director for [facility] Pulmonary Rehabilitation. ?

## 2022-03-19 DIAGNOSIS — Z48812 Encounter for surgical aftercare following surgery on the circulatory system: Secondary | ICD-10-CM | POA: Diagnosis not present

## 2022-03-19 DIAGNOSIS — Z955 Presence of coronary angioplasty implant and graft: Secondary | ICD-10-CM

## 2022-03-19 NOTE — Progress Notes (Signed)
Daily Session Note ? ?Patient Details  ?Name: Nicholas Knapp ?MRN: [id number] ?Date of Birth: [birth year] ?Referring Provider:   ?Flowsheet Row Cardiac Rehab from 11/26/2021 in [facility] Cardiac and Pulmonary Rehab  ?Referring Provider Cloretta Ned MD  ? ?  ? ? ?Encounter Date: 03/19/2022 ? ?Check In: ? Session Check In - 03/19/22 0949   ? ?  ? Check-In  ? Supervising physician immediately available to respond to emergencies See telemetry face sheet for immediately available ER MD   ? Location [facility]-Cardiac & Pulmonary Rehab   ? Staff Present Birdie Sons, MPA, RN;Melissa [place], RDN, Rowe Pavy, BA, ACSM CEP, Exercise Physiologist   ? Virtual Visit No   ? Medication changes reported     No   ? Fall or balance concerns reported    No   ? Warm-up and Cool-down Performed on first and last piece of equipment   ? Resistance Training Performed Yes   ? VAD Patient? No   ? PAD/SET Patient? No   ?  ? Pain Assessment  ? Currently in Pain? No/denies   ? ?  ?  ? ?  ? ? ? ? ? ?Social History  ? ?Tobacco Use  ?Smoking Status Former  ? Packs/day: 1.00  ? Years: 25.00  ? Pack years: 25.00  ? Types: Cigarettes  ? Quit date: 09/23/1994  ? Years since quitting: 27.5  ?Smokeless Tobacco Never  ? ? ?Goals Met:  ?Independence with exercise equipment ?Exercise tolerated well ?No report of concerns or symptoms today ?Strength training completed today ? ?Goals Unmet:  ?Not Applicable ? ?Comments: Pt able to follow exercise prescription today without complaint.  Will continue to monitor for progression. ? ? ? ?Dr. Emily Filbert is Medical Director for [place].  ?Dr. Ottie Glazier is Medical Director for [facility] Pulmonary Rehabilitation. ?

## 2022-03-21 ENCOUNTER — Encounter: Payer: Medicare Other | Admitting: *Deleted

## 2022-03-21 DIAGNOSIS — Z955 Presence of coronary angioplasty implant and graft: Secondary | ICD-10-CM

## 2022-03-21 DIAGNOSIS — Z48812 Encounter for surgical aftercare following surgery on the circulatory system: Secondary | ICD-10-CM | POA: Diagnosis not present

## 2022-03-21 NOTE — Progress Notes (Signed)
Daily Session Note ? ?Patient Details  ?Name: BUFORD BREMER ?MRN: [id number] ?Date of Birth: 1956-09-05 ?Referring Provider:   ?Flowsheet Row Cardiac Rehab from 11/26/2021 in [facility] Cardiac and Pulmonary Rehab  ?Referring Provider Cloretta Ned MD  ? ?  ? ? ?Encounter Date: 03/21/2022 ? ?Check In: ? Session Check In - 03/21/22 0957   ? ?  ? Check-In  ? Supervising physician immediately available to respond to emergencies See telemetry face sheet for immediately available ER MD   ? Location [facility]-Cardiac & Pulmonary Rehab   ? Staff Present Nyoka Cowden, RN, BSN, Ardeth Sportsman, RDN, Wilhelmina Mcardle, BS, ACSM CEP, Exercise Physiologist   ? Virtual Visit No   ? Medication changes reported     No   ? Fall or balance concerns reported    No   ? Tobacco Cessation No Change   ? Warm-up and Cool-down Performed on first and last piece of equipment   ? Resistance Training Performed Yes   ? VAD Patient? No   ? PAD/SET Patient? No   ?  ? Pain Assessment  ? Currently in Pain? No/denies   ? ?  ?  ? ?  ? ? ? ? ? ?Social History  ? ?Tobacco Use  ?Smoking Status Former  ? Packs/day: 1.00  ? Years: 25.00  ? Pack years: 25.00  ? Types: Cigarettes  ? Quit date: 09/23/1994  ? Years since quitting: 27.5  ?Smokeless Tobacco Never  ? ? ?Goals Met:  ?Independence with exercise equipment ?Exercise tolerated well ?No report of concerns or symptoms today ? ?Goals Unmet:  ?Not Applicable ? ?Comments: Pt able to follow exercise prescription today without complaint.  Will continue to monitor for progression.  ? ? ?Dr. Emily Filbert is Medical Director for [place].  ?Dr. Ottie Glazier is Medical Director for [facility] Pulmonary Rehabilitation. ?

## 2022-03-26 DIAGNOSIS — Z48812 Encounter for surgical aftercare following surgery on the circulatory system: Secondary | ICD-10-CM | POA: Diagnosis not present

## 2022-03-26 DIAGNOSIS — Z955 Presence of coronary angioplasty implant and graft: Secondary | ICD-10-CM

## 2022-03-26 NOTE — Progress Notes (Signed)
Daily Session Note ? ?Patient Details  ?Name: GEVON MARKUS ?MRN: [id number] ?Date of Birth: [birth year] ?Referring Provider:   ?Flowsheet Row Cardiac Rehab from 11/26/2021 in [facility] Cardiac and Pulmonary Rehab  ?Referring Provider Cloretta Ned MD  ? ?  ? ? ?Encounter Date: 03/26/2022 ? ?Check In: ? Session Check In - 03/26/22 0945   ? ?  ? Check-In  ? Supervising physician immediately available to respond to emergencies See telemetry face sheet for immediately available ER MD   ? Location [facility]-Cardiac & Pulmonary Rehab   ? Staff Present Birdie Sons, MPA, RN;Jessica [place], MA, RCEP, CCRP, CCET;Amanda Sommer, BA, ACSM CEP, Exercise Physiologist   ? Virtual Visit No   ? Medication changes reported     Yes   ? Comments cut metoprolol dose in half   ? Fall or balance concerns reported    No   ? Tobacco Cessation No Change   ? Warm-up and Cool-down Performed on first and last piece of equipment   ? Resistance Training Performed Yes   ? VAD Patient? No   ? PAD/SET Patient? No   ?  ? Pain Assessment  ? Currently in Pain? No/denies   ? ?  ?  ? ?  ? ? ? ? ? ?Social History  ? ?Tobacco Use  ?Smoking Status Former  ? Packs/day: 1.00  ? Years: 25.00  ? Pack years: 25.00  ? Types: Cigarettes  ? Quit date: 09/23/1994  ? Years since quitting: 27.5  ?Smokeless Tobacco Never  ? ? ?Goals Met:  ?Independence with exercise equipment ?Exercise tolerated well ?No report of concerns or symptoms today ?Strength training completed today ? ?Goals Unmet:  ?Not Applicable ? ?Comments: Pt able to follow exercise prescription today without complaint.  Will continue to monitor for progression. ? ? ? ?Dr. Emily Filbert is Medical Director for [place].  ?Dr. Ottie Glazier is Medical Director for [facility] Pulmonary Rehabilitation. ?

## 2022-03-27 ENCOUNTER — Encounter: Payer: Self-pay | Admitting: *Deleted

## 2022-03-27 DIAGNOSIS — Z955 Presence of coronary angioplasty implant and graft: Secondary | ICD-10-CM

## 2022-03-27 NOTE — Progress Notes (Signed)
Cardiac Individual Treatment Plan ? ?Patient Details  ?Name: Nicholas Knapp ?MRN: [id number] ?Date of Birth: [birth year] ?Referring Provider:   ?Flowsheet Row Cardiac Rehab from 11/26/2021 in [facility] Cardiac and Pulmonary Rehab  ?Referring Provider Youlanda Mighty MD  ? ?  ? ? ?Initial Encounter Date:  ?Flowsheet Row Cardiac Rehab from 11/26/2021 in [facility] Cardiac and Pulmonary Rehab  ?Date 11/26/21  ? ?  ? ? ?Visit Diagnosis: Status post coronary artery stent placement ? ?Patient's Home Medications on Admission: ? ?Current Outpatient Medications:  ?  aspirin 81 MG tablet, Take 81 mg by mouth every evening. , Disp: , Rfl:  ?  azelastine (ASTELIN) 0.1 % nasal spray, Place into the nose., Disp: , Rfl:  ?  CALCIUM POLYCARBOPHIL PO, Take 1 tablet by mouth daily., Disp: , Rfl:  ?  clopidogrel (PLAVIX) 75 MG tablet, Take 75 mg by mouth daily., Disp: , Rfl:  ?  clopidogrel (PLAVIX) 75 MG tablet, Take 1 tablet by mouth daily. (Patient not taking: Reported on 11/08/2021), Disp: , Rfl:  ?  diphenhydrAMINE (BENADRYL) 25 MG tablet, Take 25 mg by mouth daily as needed for allergies., Disp: , Rfl:  ?  DULoxetine (CYMBALTA) 60 MG capsule, Take 60 mg by mouth daily., Disp: , Rfl:  ?  DULoxetine (CYMBALTA) 60 MG capsule, Take 1 capsule by mouth daily. (Patient not taking: Reported on 11/08/2021), Disp: , Rfl:  ?  Empagliflozin (JARDIANCE PO), Take 20 mg by mouth daily., Disp: , Rfl:  ?  EPINEPHrine 0.3 mg/0.3 mL IJ SOAJ injection, Inject into the muscle once., Disp: , Rfl:  ?  gabapentin (NEURONTIN) 600 MG tablet, Take 1 tablet (600 mg total) by mouth every 8 (eight) hours., Disp: 90 tablet, Rfl: 2 ?  gabapentin (NEURONTIN) 600 MG tablet, 2 (two) times daily (Patient not taking: Reported on 11/08/2021), Disp: , Rfl:  ?  glimepiride (AMARYL) 2 MG tablet, Take 2 mg by mouth daily with breakfast., Disp: , Rfl:  ?  glucose blood test strip, 1 each by Other route as needed for other. Use as instructed, Disp: , Rfl:  ?  JARDIANCE 25 MG TABS  tablet, Take 25 mg by mouth daily., Disp: , Rfl:  ?  losartan (COZAAR) 100 MG tablet, Take 100 mg by mouth daily., Disp: , Rfl:  ?  losartan (COZAAR) 25 MG tablet, Take 25 mg by mouth every evening.  (Patient not taking: Reported on 11/08/2021), Disp: , Rfl:  ?  metoprolol succinate (TOPROL-XL) 25 MG 24 hr tablet, Take 25 mg by mouth every evening. , Disp: , Rfl:  ?  nitroGLYCERIN (NITROSTAT) 0.4 MG SL tablet, Place 0.4 mg under the tongue every 5 (five) minutes as needed for chest pain., Disp: , Rfl:  ?  pantoprazole (PROTONIX) 40 MG tablet, Take 40 mg by mouth daily. , Disp: , Rfl:  ?  pantoprazole (PROTONIX) 40 MG tablet, Take 1 tablet by mouth at bedtime. (Patient not taking: Reported on 11/08/2021), Disp: , Rfl:  ?  polycarbophil (FIBERCON) 625 MG tablet, Take 625 mg by mouth every evening., Disp: , Rfl:  ?  rosuvastatin (CRESTOR) 20 MG tablet, Take 20 mg by mouth every evening. , Disp: , Rfl:  ?  spironolactone (ALDACTONE) 25 MG tablet, Take by mouth., Disp: , Rfl:  ?  STUDY - ASPIRE - aspirin 81 mg or placebo tablet (PI-Sethi), Take 1 tablet by mouth every evening., Disp: , Rfl:  ?  traZODone (DESYREL) 50 MG tablet, Take 50 mg by mouth at bedtime as  needed for sleep., Disp: , Rfl:  ? ?Past Medical History: ?Past Medical History:  ?Diagnosis Date  ? Allergic state   ? Anxiety   ? Asthma   ? Coronary artery disease   ? Depression   ? Diabetes mellitus without complication (HCC)   ? Diverticulosis   ? GERD (gastroesophageal reflux disease)   ? Hyperlipemia   ? Hypertension   ? Midsystolic murmur   ? Myocardial infarction [facility]) 1995 and 2008  ? Prostatitis   ? Proteinuria   ? Sleep apnea   ? ? ?Tobacco Use: ?Social History  ? ?Tobacco Use  ?Smoking Status Former  ? Packs/day: 1.00  ? Years: 25.00  ? Pack years: 25.00  ? Types: Cigarettes  ? Quit date: 09/23/1994  ? Years since quitting: 27.5  ?Smokeless Tobacco Never  ? ? ?Labs: ?Review Flowsheet   ? ?    ? View : No data to display.  ?  ?  ?  ?  ?  ? ? ? ?Exercise  Target Goals: ?Exercise Program Goal: ?Individual exercise prescription set using results from initial 6 min walk test and THRR while considering  patient?s activity barriers and safety.  ? ?Exercise Prescription Goal: ?Initial exercise prescription builds to 30-45 minutes a day of aerobic activity, 2-3 days per week.  Home exercise guidelines will be given to patient during program as part of exercise prescription that the participant will acknowledge. ? ? ?Education: Aerobic Exercise: ?- Group verbal and visual presentation on the components of exercise prescription. Introduces F.I.T.T principle from ACSM for exercise prescriptions.  Reviews F.I.T.T. principles of aerobic exercise including progression. Written material given at graduation. ?Flowsheet Row Cardiac Rehab from 03/07/2022 in [facility] Cardiac and Pulmonary Rehab  ?Education need identified 11/26/21  ? ?  ? ? ?Education: Resistance Exercise: ?- Group verbal and visual presentation on the components of exercise prescription. Introduces F.I.T.T principle from ACSM for exercise prescriptions  Reviews F.I.T.T. principles of resistance exercise including progression. Written material given at graduation. ?Flowsheet Row Cardiac Rehab from 03/07/2022 in [facility] Cardiac and Pulmonary Rehab  ?Date 12/13/21  ?Educator [facility]  ?Instruction Review Code 1- Verbalizes Understanding  ? ?  ? ?  ?Education: Exercise & Equipment Safety: ?- Individual verbal instruction and demonstration of equipment use and safety with use of the equipment. ?Flowsheet Row Cardiac Rehab from 03/07/2022 in [facility] Cardiac and Pulmonary Rehab  ?Date 11/08/21  ?Educator [facility]  ?Instruction Review Code 1- Verbalizes Understanding  ? ?  ? ? ?Education: Exercise Physiology & General Exercise Guidelines: ?- Group verbal and written instruction with models to review the exercise physiology of the cardiovascular system and associated critical values. Provides general exercise guidelines with specific guidelines to  those with heart or lung disease.  ? ? ?Education: Flexibility, Balance, Mind/Body Relaxation: ?- Group verbal and visual presentation with interactive activity on the components of exercise prescription. Introduces F.I.T.T principle from ACSM for exercise prescriptions. Reviews F.I.T.T. principles of flexibility and balance exercise training including progression. Also discusses the mind body connection.  Reviews various relaxation techniques to help reduce and manage stress (i.e. Deep breathing, progressive muscle relaxation, and visualization). Balance handout provided to take home. Written material given at graduation. ?Flowsheet Row Cardiac Rehab from 03/07/2022 in [facility] Cardiac and Pulmonary Rehab  ?Date 02/28/22  ?Educator [facility]  ?Instruction Review Code 1- Verbalizes Understanding  ? ?  ? ? ?Activity Barriers & Risk Stratification: ? Activity Barriers & Cardiac Risk Stratification - 11/26/21 1111   ? ?  ?  Activity Barriers & Cardiac Risk Stratification  ? Activity Barriers Muscular Weakness;Deconditioning;Shortness of Breath;Back Problems;Neck/Spine Problems;Balance Concerns   chronic back pain  ? Cardiac Risk Stratification High   ? ?  ?  ? ?  ? ? ?6 Minute Walk: ? 6 Minute Walk   ? ? Row Name 11/26/21 1110  ?  ?  ?  ? 6 Minute Walk  ? Phase Initial    ? Distance 1275 feet    ? Walk Time 6 minutes    ? # of Rest Breaks 0    ? MPH 2.41    ? METS 3    ? RPE 11    ? Perceived Dyspnea  1    ? VO2 Peak 10.53    ? Symptoms Yes (comment)    ? Comments SOB    ? Resting HR 66 bpm    ? Resting BP 108/64    ? Resting Oxygen Saturation  97 %    ? Exercise Oxygen Saturation  during 6 min walk 96 %    ? Max Ex. HR 128 bpm    ? Max Ex. BP 122/68    ? 2 Minute Post BP 112/60    ? ?  ?  ? ?  ? ? ?Oxygen Initial Assessment: ? ? ?Oxygen Re-Evaluation: ? ? ?Oxygen Discharge (Final Oxygen Re-Evaluation): ? ? ?Initial Exercise Prescription: ? Initial Exercise Prescription - 11/26/21 1100   ? ?  ? Date of Initial Exercise RX and  Referring Provider  ? Date 11/26/21   ? Referring Provider Youlanda MightyBlazing, Michael MD   ?  ? Oxygen  ? Maintain Oxygen Saturation 88% or higher   ?  ? Treadmill  ? MPH 2.2   ? Grade 0.5   ? Minutes 15   ? METs 2.84   ?

## 2022-03-28 VITALS — Ht 69.8 in | Wt 191.8 lb

## 2022-03-28 DIAGNOSIS — Z955 Presence of coronary angioplasty implant and graft: Secondary | ICD-10-CM

## 2022-03-28 DIAGNOSIS — Z48812 Encounter for surgical aftercare following surgery on the circulatory system: Secondary | ICD-10-CM | POA: Diagnosis not present

## 2022-03-28 NOTE — Progress Notes (Signed)
Daily Session Note ? ?Patient Details  ?Name: Nicholas Knapp ?MRN: [id number] ?Date of Birth: 1956/03/31 ?Referring Provider:   ?Flowsheet Row Cardiac Rehab from 11/26/2021 in [facility] Cardiac and Pulmonary Rehab  ?Referring Provider Cloretta Ned MD  ? ?  ? ? ?Encounter Date: 03/28/2022 ? ?Check In: ? Session Check In - 03/28/22 0926   ? ?  ? Check-In  ? Supervising physician immediately available to respond to emergencies See telemetry face sheet for immediately available ER MD   ? Location [facility]-Cardiac & Pulmonary Rehab   ? Staff Present Birdie Sons, MPA, RN;Amanda Sommer, BA, ACSM CEP, Exercise Physiologist;Eldena Dede Amed[place], BS, ACSM CEP, Exercise Physiologist   ? Virtual Visit No   ? Medication changes reported     No   ? Fall or balance concerns reported    No   ? Tobacco Cessation No Change   ? Warm-up and Cool-down Performed on first and last piece of equipment   ? Resistance Training Performed Yes   ? VAD Patient? No   ? PAD/SET Patient? No   ?  ? Pain Assessment  ? Currently in Pain? No/denies   ? ?  ?  ? ?  ? ? ? ? ? ?Social History  ? ?Tobacco Use  ?Smoking Status Former  ? Packs/day: 1.00  ? Years: 25.00  ? Pack years: 25.00  ? Types: Cigarettes  ? Quit date: 09/23/1994  ? Years since quitting: 27.5  ?Smokeless Tobacco Never  ? ? ?Goals Met:  ?Independence with exercise equipment ?Exercise tolerated well ?No report of concerns or symptoms today ?Strength training completed today ? ?Goals Unmet:  ?Not Applicable ? ?Comments: Pt able to follow exercise prescription today without complaint.  Will continue to monitor for progression. ? ? 6 Minute Walk   ? ? [place] Name 11/26/21 1110 03/28/22 1017  ?  ?  ? 6 Minute Walk  ? Phase Initial Discharge   ? Distance 1275 feet 1880 feet   ? Distance % Change -- 47.4 %   ? Distance Feet Change -- 605 ft   ? Walk Time 6 minutes 6 minutes   ? # of Rest Breaks 0 0   ? MPH 2.41 3.56   ? METS 3 4.11   ? RPE 11 13   ? Perceived Dyspnea  1 --   ? VO2 Peak 10.53 15.44   ? Symptoms  Yes (comment) No   ? Comments SOB --   ? Resting HR 66 bpm 64 bpm   ? Resting BP 108/64 126/74   ? Resting Oxygen Saturation  97 % 98 %   ? Exercise Oxygen Saturation  during 6 min walk 96 % 97 %   ? Max Ex. HR 128 bpm 115 bpm   ? Max Ex. BP 122/68 156/66   ? 2 Minute Post BP 112/60 --   ? ?  ?  ? ?  ? ? ? ?Dr. Emily Filbert is Medical Director for [place].  ?Dr. Ottie Glazier is Medical Director for [facility] Pulmonary Rehabilitation. ?

## 2022-04-02 DIAGNOSIS — Z955 Presence of coronary angioplasty implant and graft: Secondary | ICD-10-CM

## 2022-04-02 DIAGNOSIS — Z48812 Encounter for surgical aftercare following surgery on the circulatory system: Secondary | ICD-10-CM | POA: Diagnosis not present

## 2022-04-02 NOTE — Progress Notes (Signed)
Daily Session Note ? ?Patient Details  ?Name: Nicholas Knapp ?MRN: [id number] ?Date of Birth: 08/14/1956 ?Referring Provider:   ?Flowsheet Row Cardiac Rehab from 11/26/2021 in [facility] Cardiac and Pulmonary Rehab  ?Referring Provider Cloretta Ned MD  ? ?  ? ? ?Encounter Date: 04/02/2022 ? ?Check In: ? Session Check In - 04/02/22 0952   ? ?  ? Check-In  ? Supervising physician immediately available to respond to emergencies See telemetry face sheet for immediately available ER MD   ? Location [facility]-Cardiac & Pulmonary Rehab   ? Staff Present Birdie Sons, MPA, RN;Joseph [place], RCP,RRT,BSRT;Jessica [place], MA, RCEP, CCRP, CCET   ? Virtual Visit No   ? Medication changes reported     No   ? Fall or balance concerns reported    No   ? Tobacco Cessation No Change   ? Warm-up and Cool-down Performed on first and last piece of equipment   ? Resistance Training Performed Yes   ? VAD Patient? No   ? PAD/SET Patient? No   ?  ? Pain Assessment  ? Currently in Pain? No/denies   ? ?  ?  ? ?  ? ? ? ? ? ?Social History  ? ?Tobacco Use  ?Smoking Status Former  ? Packs/day: 1.00  ? Years: 25.00  ? Pack years: 25.00  ? Types: Cigarettes  ? Quit date: 09/23/1994  ? Years since quitting: 27.5  ?Smokeless Tobacco Never  ? ? ?Goals Met:  ?Independence with exercise equipment ?Exercise tolerated well ?No report of concerns or symptoms today ?Strength training completed today ? ?Goals Unmet:  ?Not Applicable ? ?Comments: Pt able to follow exercise prescription today without complaint.  Will continue to monitor for progression. ? ? ? ?Dr. Emily Filbert is Medical Director for [place].  ?Dr. Ottie Glazier is Medical Director for [facility] Pulmonary Rehabilitation. ?

## 2022-04-04 DIAGNOSIS — Z955 Presence of coronary angioplasty implant and graft: Secondary | ICD-10-CM

## 2022-04-04 DIAGNOSIS — Z48812 Encounter for surgical aftercare following surgery on the circulatory system: Secondary | ICD-10-CM | POA: Diagnosis not present

## 2022-04-04 NOTE — Progress Notes (Signed)
Daily Session Note ? ?Patient Details  ?Name: Nicholas Knapp ?MRN: [id number] ?Date of Birth: 1956-09-25 ?Referring Provider:   ?Flowsheet Row Cardiac Rehab from 11/26/2021 in [facility] Cardiac and Pulmonary Rehab  ?Referring Provider Cloretta Ned MD  ? ?  ? ? ?Encounter Date: 04/04/2022 ? ?Check In: ? Session Check In - 04/04/22 0938   ? ?  ? Check-In  ? Supervising physician immediately available to respond to emergencies See telemetry face sheet for immediately available ER MD   ? Location [facility]-Cardiac & Pulmonary Rehab   ? Staff Present Birdie Sons, MPA, RN;Joseph [place], RCP,RRT,BSRT;Melissa [place], RDN, LDN   ? Virtual Visit No   ? Medication changes reported     No   ? Fall or balance concerns reported    No   ? Tobacco Cessation No Change   ? Warm-up and Cool-down Performed on first and last piece of equipment   ? Resistance Training Performed Yes   ? VAD Patient? No   ? PAD/SET Patient? No   ?  ? Pain Assessment  ? Currently in Pain? No/denies   ? ?  ?  ? ?  ? ? ? ? ? ?Social History  ? ?Tobacco Use  ?Smoking Status Former  ? Packs/day: 1.00  ? Years: 25.00  ? Pack years: 25.00  ? Types: Cigarettes  ? Quit date: 09/23/1994  ? Years since quitting: 27.5  ?Smokeless Tobacco Never  ? ? ?Goals Met:  ?Independence with exercise equipment ?Exercise tolerated well ?No report of concerns or symptoms today ?Strength training completed today ? ?Goals Unmet:  ?Not Applicable ? ?Comments: Pt able to follow exercise prescription today without complaint.  Will continue to monitor for progression. ? ? ? ?Dr. Emily Filbert is Medical Director for [place].  ?Dr. Ottie Glazier is Medical Director for [facility] Pulmonary Rehabilitation. ?

## 2022-04-04 NOTE — Patient Instructions (Signed)
Discharge Patient Instructions ? ?Patient Details  ?Name: Nicholas Knapp ?MRN: [id number] ?Date of Birth: 1956/04/11 ?Referring Provider:  Lolita Patella, MD ? ? ?Number of Visits: 37 ? ?Reason for Discharge:  ?Patient reached a stable level of exercise. ?Patient independent in their exercise. ?Patient has met program and personal goals. ? ?Smoking History:  ?Social History  ? ?Tobacco Use  ?Smoking Status Former  ? Packs/day: 1.00  ? Years: 25.00  ? Pack years: 25.00  ? Types: Cigarettes  ? Quit date: 09/23/1994  ? Years since quitting: 27.5  ?Smokeless Tobacco Never  ? ? ?Diagnosis:  ?Status post coronary artery stent placement ? ?Initial Exercise Prescription: ? Initial Exercise Prescription - 11/26/21 1100   ? ?  ? Date of Initial Exercise RX and Referring Provider  ? Date 11/26/21   ? Referring Provider Cloretta Ned MD   ?  ? Oxygen  ? Maintain Oxygen Saturation 88% or higher   ?  ? Treadmill  ? MPH 2.2   ? Grade 0.5   ? Minutes 15   ? METs 2.84   ?  ? Recumbant Elliptical  ? Level 1   ? RPM 50   ? Minutes 15   ? METs 2.5   ?  ? REL-XR  ? Level 2   ? Speed 50   ? Minutes 15   ? METs 2.5   ?  ? Prescription Details  ? Frequency (times per week) 2   ? Duration Progress to 30 minutes of continuous aerobic without signs/symptoms of physical distress   ?  ? Intensity  ? THRR 40-80% of Max Heartrate 101-136   ? Ratings of Perceived Exertion 11-13   ? Perceived Dyspnea 0-4   ?  ? Progression  ? Progression Continue to progress workloads to maintain intensity without signs/symptoms of physical distress.   ?  ? Resistance Training  ? Training Prescription Yes   ? Weight 4 lb   ? Reps 10-15   ? ?  ?  ? ?  ? ? ?Discharge Exercise Prescription (Final Exercise Prescription Changes): ? Exercise Prescription Changes - 03/26/22 1300   ? ?  ? Response to Exercise  ? Blood Pressure (Admit) 112/56   ? Blood Pressure (Exit) 118/64   ? Heart Rate (Admit) 67 bpm   ? Heart Rate (Exercise) 91 bpm   ? Heart Rate (Exit) 71 bpm    ? Oxygen Saturation (Admit) 98 %   ? Oxygen Saturation (Exercise) 94 %   ? Oxygen Saturation (Exit) 97 %   ? Rating of Perceived Exertion (Exercise) 12   ? Symptoms none   ? Duration Continue with 30 min of aerobic exercise without signs/symptoms of physical distress.   ? Intensity THRR unchanged   ?  ? Progression  ? Progression Continue to progress workloads to maintain intensity without signs/symptoms of physical distress.   ? Average METs 2.81   ?  ? Resistance Training  ? Training Prescription Yes   ? Weight 4 lb   ? Reps 10-15   ?  ? Interval Training  ? Interval Training No   ?  ? Treadmill  ? MPH 2.9   ? Grade 1   ? Minutes 15   ? METs 3.62   ?  ? Track  ? Laps 22   ? Minutes 15   ? METs 2.2   ?  ? Home Exercise Plan  ? Plans to continue exercise at Home (comment)   walking  ?  Frequency Add 3 additional days to program exercise sessions.   start with 1  ? Initial Home Exercises Provided 01/10/22   ? ?  ?  ? ?  ? ? ?Functional Capacity: ? 6 Minute Walk   ? ? [place] Name 11/26/21 1110 03/28/22 1017  ?  ?  ? 6 Minute Walk  ? Phase Initial Discharge   ? Distance 1275 feet 1880 feet   ? Distance % Change -- 47.4 %   ? Distance Feet Change -- 605 ft   ? Walk Time 6 minutes 6 minutes   ? # of Rest Breaks 0 0   ? MPH 2.41 3.56   ? METS 3 4.11   ? RPE 11 13   ? Perceived Dyspnea  1 --   ? VO2 Peak 10.53 15.44   ? Symptoms Yes (comment) No   ? Comments SOB --   ? Resting HR 66 bpm 64 bpm   ? Resting BP 108/64 126/74   ? Resting Oxygen Saturation  97 % 98 %   ? Exercise Oxygen Saturation  during 6 min walk 96 % 97 %   ? Max Ex. HR 128 bpm 115 bpm   ? Max Ex. BP 122/68 156/66   ? 2 Minute Post BP 112/60 --   ? ?  ?  ? ?  ? ? ? ? ? ?Nutrition & Weight - Outcomes: ? Pre Biometrics - 11/26/21 1114   ? ?  ? Pre Biometrics  ? Height 5' 9.8" (1.773 m)   ? Weight 197 lb 6.4 oz (89.5 kg)   ? BMI (Calculated) 28.48   ? Single Leg Stand 1.6 seconds   ? ?  ?  ? ?  ? ? Post Biometrics - 03/28/22 1018   ? ?  ?  Post  Biometrics  ?  Height 5' 9.8" (1.773 m)   ? Weight 191 lb 12.8 oz (87 kg)   ? BMI (Calculated) 27.68   ? Single Leg Stand 5.5 seconds   ? ?  ?  ? ?  ? ? ?Nutrition: ? Nutrition Therapy & Goals - 12/31/21 1001   ? ?  ? Nutrition Therapy  ? Diet Heart healthy, low Na, T2DM friendly   ? Drug/Food Interactions Statins/Certain Fruits   ? Protein (specify units) 70g   ? Fiber 30 grams   ? Whole Grain Foods 3 servings   ? Saturated Fats 12 max. grams   ? Fruits and Vegetables 8 servings/day   ? Sodium 2 grams   ?  ? Personal Nutrition Goals  ? Nutrition Goal ST: consider trying daves killer bread for his whole grains, adding healthy fats with fruit like peanut butter with grapes LT: maintain A1C <7, limit Na <2g/day, make at least 1/2 grains whole grains   ? Comments 66 y.o. M admitted to [place] rehab s/p stent placement. PMHx includes. PSHx includes CAD, T2DM (A1C 12/19/21: 7.1), dyspnea on exertion, dyspepsia, HTN. Relevant medications include fibercon, cymbalta, jardiance, glimepiride, protonix, crestor, trazadone. Subjective: He reports lowering sugar intake. B: egg sandwich (white bread) L: burger or something out, sandwich at home like pimento cheese D: lean meats and salads a couple of times per week. He reports eating fruit more at snacks and meals - he doesn't snack often. Drinks: water, coke occasionally. He will cook with olive oil, goes out to eat <1x/week, uses some salt after cooking. He reports having nutrition education at [facility] since he was 17. PYP Score: 62. Vegetables &  Fruits 9/12. Breads, Grains & Cereals 6/12. Red & Processed Meat 10/12. Poultry 0/2. Fish & Shellfish 1/4. Beans, Nuts & Seeds 0/4. Milk & Dairy Foods 4/6. Toppings, Oils, Seasonings & Salt 13/20. Sweets, Snacks & Restaurant Food 12/14. Beverages 7/10. Discussed diabetes and heart healthy nutrition. Suggested switching to whole wheat bread and adding in heart healthy fats like nuts and seeds with his fruit.   ?  ? Intervention Plan  ?  Intervention Prescribe, educate and counsel regarding individualized specific dietary modifications aiming towards targeted core components such as weight, hypertension, lipid management, diabetes, heart failure and other comorbidities.   ? Expected Outcomes Short Term Goal: Understand basic principles of dietary content, such as calories, fat, sodium, cholesterol and nutrients.;Short Term Goal: A plan has been developed with personal nutrition goals set during dietitian appointment.;Long Term Goal: Adherence to prescribed nutrition plan.   ? ?  ?  ? ?  ? ? ? ?Goals reviewed with patient; copy given to patient. ?

## 2022-04-11 ENCOUNTER — Encounter: Payer: Medicare Other | Attending: Cardiovascular Disease

## 2022-04-11 DIAGNOSIS — Z955 Presence of coronary angioplasty implant and graft: Secondary | ICD-10-CM | POA: Insufficient documentation

## 2022-04-11 NOTE — Progress Notes (Signed)
Cardiac Individual Treatment Plan ? ?Patient Details  ?Name: Nicholas Knapp ?MRN: [id number] ?Date of Birth: [birth year] ?Referring Provider:   ?Flowsheet Row Cardiac Rehab from 11/26/2021 in [facility] Cardiac and Pulmonary Rehab  ?Referring Provider Youlanda Mighty MD  ? ?  ? ? ?Initial Encounter Date:  ?Flowsheet Row Cardiac Rehab from 11/26/2021 in [facility] Cardiac and Pulmonary Rehab  ?Date 11/26/21  ? ?  ? ? ?Visit Diagnosis: Status post coronary artery stent placement ? ?Patient's Home Medications on Admission: ? ?Current Outpatient Medications:  ?  aspirin 81 MG tablet, Take 81 mg by mouth every evening. , Disp: , Rfl:  ?  azelastine (ASTELIN) 0.1 % nasal spray, Place into the nose., Disp: , Rfl:  ?  CALCIUM POLYCARBOPHIL PO, Take 1 tablet by mouth daily., Disp: , Rfl:  ?  clopidogrel (PLAVIX) 75 MG tablet, Take 75 mg by mouth daily., Disp: , Rfl:  ?  clopidogrel (PLAVIX) 75 MG tablet, Take 1 tablet by mouth daily. (Patient not taking: Reported on 11/08/2021), Disp: , Rfl:  ?  diphenhydrAMINE (BENADRYL) 25 MG tablet, Take 25 mg by mouth daily as needed for allergies., Disp: , Rfl:  ?  DULoxetine (CYMBALTA) 60 MG capsule, Take 60 mg by mouth daily., Disp: , Rfl:  ?  DULoxetine (CYMBALTA) 60 MG capsule, Take 1 capsule by mouth daily. (Patient not taking: Reported on 11/08/2021), Disp: , Rfl:  ?  Empagliflozin (JARDIANCE PO), Take 20 mg by mouth daily., Disp: , Rfl:  ?  EPINEPHrine 0.3 mg/0.3 mL IJ SOAJ injection, Inject into the muscle once., Disp: , Rfl:  ?  gabapentin (NEURONTIN) 600 MG tablet, Take 1 tablet (600 mg total) by mouth every 8 (eight) hours., Disp: 90 tablet, Rfl: 2 ?  gabapentin (NEURONTIN) 600 MG tablet, 2 (two) times daily (Patient not taking: Reported on 11/08/2021), Disp: , Rfl:  ?  glimepiride (AMARYL) 2 MG tablet, Take 2 mg by mouth daily with breakfast., Disp: , Rfl:  ?  glucose blood test strip, 1 each by Other route as needed for other. Use as instructed, Disp: , Rfl:  ?  JARDIANCE 25 MG TABS  tablet, Take 25 mg by mouth daily., Disp: , Rfl:  ?  losartan (COZAAR) 100 MG tablet, Take 100 mg by mouth daily., Disp: , Rfl:  ?  losartan (COZAAR) 25 MG tablet, Take 25 mg by mouth every evening.  (Patient not taking: Reported on 11/08/2021), Disp: , Rfl:  ?  metoprolol succinate (TOPROL-XL) 25 MG 24 hr tablet, Take 25 mg by mouth every evening. , Disp: , Rfl:  ?  nitroGLYCERIN (NITROSTAT) 0.4 MG SL tablet, Place 0.4 mg under the tongue every 5 (five) minutes as needed for chest pain., Disp: , Rfl:  ?  pantoprazole (PROTONIX) 40 MG tablet, Take 40 mg by mouth daily. , Disp: , Rfl:  ?  pantoprazole (PROTONIX) 40 MG tablet, Take 1 tablet by mouth at bedtime. (Patient not taking: Reported on 11/08/2021), Disp: , Rfl:  ?  polycarbophil (FIBERCON) 625 MG tablet, Take 625 mg by mouth every evening., Disp: , Rfl:  ?  rosuvastatin (CRESTOR) 20 MG tablet, Take 20 mg by mouth every evening. , Disp: , Rfl:  ?  spironolactone (ALDACTONE) 25 MG tablet, Take by mouth., Disp: , Rfl:  ?  STUDY - ASPIRE - aspirin 81 mg or placebo tablet (PI-Sethi), Take 1 tablet by mouth every evening., Disp: , Rfl:  ?  traZODone (DESYREL) 50 MG tablet, Take 50 mg by mouth at bedtime as  needed for sleep., Disp: , Rfl:  ? ?Past Medical History: ?Past Medical History:  ?Diagnosis Date  ? Allergic state   ? Anxiety   ? Asthma   ? Coronary artery disease   ? Depression   ? Diabetes mellitus without complication ([place])   ? Diverticulosis   ? GERD (gastroesophageal reflux disease)   ? Hyperlipemia   ? Hypertension   ? Midsystolic murmur   ? Myocardial infarction [facility]) 1995 and 2008  ? Prostatitis   ? Proteinuria   ? Sleep apnea   ? ? ?Tobacco Use: ?Social History  ? ?Tobacco Use  ?Smoking Status Former  ? Packs/day: 1.00  ? Years: 25.00  ? Pack years: 25.00  ? Types: Cigarettes  ? Quit date: 09/23/1994  ? Years since quitting: 27.5  ?Smokeless Tobacco Never  ? ? ?Labs: ?Review Flowsheet   ? ?    ? View : No data to display.  ?  ?  ?  ?  ?  ? ? ? ?Exercise  Target Goals: ?Exercise Program Goal: ?Individual exercise prescription set using results from initial 6 min walk test and THRR while considering  patient?s activity barriers and safety.  ? ?Exercise Prescription Goal: ?Initial exercise prescription builds to 30-45 minutes a day of aerobic activity, 2-3 days per week.  Home exercise guidelines will be given to patient during program as part of exercise prescription that the participant will acknowledge. ? ? ?Education: Aerobic Exercise: ?- Group verbal and visual presentation on the components of exercise prescription. Introduces F.I.T.T principle from ACSM for exercise prescriptions.  Reviews F.I.T.T. principles of aerobic exercise including progression. Written material given at graduation. ?Flowsheet Row Cardiac Rehab from 04/04/2022 in [facility] Cardiac and Pulmonary Rehab  ?Education need identified 11/26/21  ? ?  ? ? ?Education: Resistance Exercise: ?- Group verbal and visual presentation on the components of exercise prescription. Introduces F.I.T.T principle from ACSM for exercise prescriptions  Reviews F.I.T.T. principles of resistance exercise including progression. Written material given at graduation. ?Flowsheet Row Cardiac Rehab from 04/04/2022 in [facility] Cardiac and Pulmonary Rehab  ?Date 12/13/21  ?Educator [facility]  ?Instruction Review Code 1- Verbalizes Understanding  ? ?  ? ?  ?Education: Exercise & Equipment Safety: ?- Individual verbal instruction and demonstration of equipment use and safety with use of the equipment. ?Flowsheet Row Cardiac Rehab from 04/04/2022 in [facility] Cardiac and Pulmonary Rehab  ?Date 11/08/21  ?Educator [facility]  ?Instruction Review Code 1- Verbalizes Understanding  ? ?  ? ? ?Education: Exercise Physiology & General Exercise Guidelines: ?- Group verbal and written instruction with models to review the exercise physiology of the cardiovascular system and associated critical values. Provides general exercise guidelines with specific guidelines to  those with heart or lung disease.  ?Flowsheet Row Cardiac Rehab from 04/04/2022 in [facility] Cardiac and Pulmonary Rehab  ?Date 04/04/22  ?Educator [facility]  ?Instruction Review Code 1- Verbalizes Understanding  ? ?  ? ? ?Education: Flexibility, Balance, Mind/Body Relaxation: ?- Group verbal and visual presentation with interactive activity on the components of exercise prescription. Introduces F.I.T.T principle from ACSM for exercise prescriptions. Reviews F.I.T.T. principles of flexibility and balance exercise training including progression. Also discusses the mind body connection.  Reviews various relaxation techniques to help reduce and manage stress (i.e. Deep breathing, progressive muscle relaxation, and visualization). Balance handout provided to take home. Written material given at graduation. ?Flowsheet Row Cardiac Rehab from 04/04/2022 in [facility] Cardiac and Pulmonary Rehab  ?Date 02/28/22  ?Educator [facility]  ?Instruction Review Code  1- Verbalizes Understanding  ? ?  ? ? ?Activity Barriers & Risk Stratification: ? Activity Barriers & Cardiac Risk Stratification - 11/26/21 1111   ? ?  ? Activity Barriers & Cardiac Risk Stratification  ? Activity Barriers Muscular Weakness;Deconditioning;Shortness of Breath;Back Problems;Neck/Spine Problems;Balance Concerns   chronic back pain  ? Cardiac Risk Stratification High   ? ?  ?  ? ?  ? ? ?6 Minute Walk: ? 6 Minute Walk   ? ? [place] Name 11/26/21 1110 03/28/22 1017  ?  ?  ? 6 Minute Walk  ? Phase Initial Discharge   ? Distance 1275 feet 1880 feet   ? Distance % Change -- 47.4 %   ? Distance Feet Change -- 605 ft   ? Walk Time 6 minutes 6 minutes   ? # of Rest Breaks 0 0   ? MPH 2.41 3.56   ? METS 3 4.11   ? RPE 11 13   ? Perceived Dyspnea  1 --   ? VO2 Peak 10.53 15.44   ? Symptoms Yes (comment) No   ? Comments SOB --   ? Resting HR 66 bpm 64 bpm   ? Resting BP 108/64 126/74   ? Resting Oxygen Saturation  97 % 98 %   ? Exercise Oxygen Saturation  during 6 min walk 96 % 97 %   ? Max Ex. HR  128 bpm 115 bpm   ? Max Ex. BP 122/68 156/66   ? 2 Minute Post BP 112/60 --   ? ?  ?  ? ?  ? ? ?Oxygen Initial Assessment: ? ? ?Oxygen Re-Evaluation: ? ? ?Oxygen Discharge (Final Oxygen Re-Evaluation): ? ? ?

## 2022-04-11 NOTE — Progress Notes (Signed)
Discharge Progress Report ? ?Patient Details  ?Name: Nicholas Knapp ?MRN: [id number] ?Date of Birth: 1956-03-07 ?Referring Provider:   ?Flowsheet Row Cardiac Rehab from 11/26/2021 in [facility] Cardiac and Pulmonary Rehab  ?Referring Provider Cloretta Ned MD  ? ?  ? ? ? ?Number of Visits: 36 ? ?Reason for Discharge:  ?Patient reached a stable level of exercise. ?Patient independent in their exercise. ?Patient has met program and personal goals. ? ?Smoking History:  ?Social History  ? ?Tobacco Use  ?Smoking Status Former  ? Packs/day: 1.00  ? Years: 25.00  ? Pack years: 25.00  ? Types: Cigarettes  ? Quit date: 09/23/1994  ? Years since quitting: 27.5  ?Smokeless Tobacco Never  ? ? ?Diagnosis:  ?Status post coronary artery stent placement ? ?ADL UCSD: ? ? ?Initial Exercise Prescription: ? Initial Exercise Prescription - 11/26/21 1100   ? ?  ? Date of Initial Exercise RX and Referring Provider  ? Date 11/26/21   ? Referring Provider Cloretta Ned MD   ?  ? Oxygen  ? Maintain Oxygen Saturation 88% or higher   ?  ? Treadmill  ? MPH 2.2   ? Grade 0.5   ? Minutes 15   ? METs 2.84   ?  ? Recumbant Elliptical  ? Level 1   ? RPM 50   ? Minutes 15   ? METs 2.5   ?  ? REL-XR  ? Level 2   ? Speed 50   ? Minutes 15   ? METs 2.5   ?  ? Prescription Details  ? Frequency (times per week) 2   ? Duration Progress to 30 minutes of continuous aerobic without signs/symptoms of physical distress   ?  ? Intensity  ? THRR 40-80% of Max Heartrate 101-136   ? Ratings of Perceived Exertion 11-13   ? Perceived Dyspnea 0-4   ?  ? Progression  ? Progression Continue to progress workloads to maintain intensity without signs/symptoms of physical distress.   ?  ? Resistance Training  ? Training Prescription Yes   ? Weight 4 lb   ? Reps 10-15   ? ?  ?  ? ?  ? ? ?Discharge Exercise Prescription (Final Exercise Prescription Changes): ? Exercise Prescription Changes - 04/10/22 1000   ? ?  ? Response to Exercise  ? Blood Pressure (Admit) 122/64   ? Blood  Pressure (Exit) 120/60   ? Heart Rate (Admit) 56 bpm   ? Heart Rate (Exercise) 104 bpm   ? Heart Rate (Exit) 74 bpm   ? Oxygen Saturation (Admit) 98 %   ? Oxygen Saturation (Exercise) 95 %   ? Oxygen Saturation (Exit) 98 %   ? Rating of Perceived Exertion (Exercise) 12   ? Symptoms none   ? Duration Continue with 30 min of aerobic exercise without signs/symptoms of physical distress.   ? Intensity THRR unchanged   ?  ? Progression  ? Progression Continue to progress workloads to maintain intensity without signs/symptoms of physical distress.   ? Average METs 3.82   ?  ? Resistance Training  ? Training Prescription Yes   ? Weight 4 lb   ? Reps 10-15   ?  ? Interval Training  ? Interval Training No   ?  ? Treadmill  ? MPH 2.7   ? Grade 1   ? Minutes 15   ? METs 3.44   ?  ? NuStep  ? Level 3   ? Minutes 15   ?  ?  Track  ? Laps 22   ? Minutes 15   ? METs 2.2   ?  ? Home Exercise Plan  ? Plans to continue exercise at Home (comment)   walking  ? Frequency Add 3 additional days to program exercise sessions.   start with 1  ? Initial Home Exercises Provided 01/10/22   ?  ? Oxygen  ? Maintain Oxygen Saturation 88% or higher   ? ?  ?  ? ?  ? ? ?Functional Capacity: ? 6 Minute Walk   ? ? [place] Name 11/26/21 1110 03/28/22 1017  ?  ?  ? 6 Minute Walk  ? Phase Initial Discharge   ? Distance 1275 feet 1880 feet   ? Distance % Change -- 47.4 %   ? Distance Feet Change -- 605 ft   ? Walk Time 6 minutes 6 minutes   ? # of Rest Breaks 0 0   ? MPH 2.41 3.56   ? METS 3 4.11   ? RPE 11 13   ? Perceived Dyspnea  1 --   ? VO2 Peak 10.53 15.44   ? Symptoms Yes (comment) No   ? Comments SOB --   ? Resting HR 66 bpm 64 bpm   ? Resting BP 108/64 126/74   ? Resting Oxygen Saturation  97 % 98 %   ? Exercise Oxygen Saturation  during 6 min walk 96 % 97 %   ? Max Ex. HR 128 bpm 115 bpm   ? Max Ex. BP 122/68 156/66   ? 2 Minute Post BP 112/60 --   ? ?  ?  ? ?  ? ? ?Psychological, QOL, Others - Outcomes: ?PHQ 2/9: ? ?  04/11/2022  ? 10:55 AM 11/26/2021   ? 11:17 AM 01/30/2021  ? 11:01 AM 10/03/2020  ? 11:31 AM  ?Depression screen PHQ 2/9  ?Decreased Interest 0 1 0 0  ?Down, Depressed, Hopeless 0 0 0 0  ?PHQ - 2 Score 0 1 0 0  ?Altered sleeping 0 1    ?Tired, decreased energy 2 1    ?Change in appetite 0 0    ?Feeling bad or failure about yourself  0 0    ?Trouble concentrating 0 0    ?Moving slowly or fidgety/restless 0 0    ?Suicidal thoughts 0 0    ?PHQ-9 Score 2 3    ?Difficult doing work/chores Not difficult at all Somewhat difficult    ? ? ?Quality of Life: ? Quality of Life - 04/11/22 1111   ? ?  ? Quality of Life Scores  ? Health/Function Pre 22.4 %   ? Health/Function Post 27.5 %   ? Health/Function % Change 22.77 %   ? Socioeconomic Pre 27.86 %   ? Socioeconomic Post 30 %   ? Socioeconomic % Change  7.68 %   ? Psych/Spiritual Pre 28.29 %   ? Psych/Spiritual Post 30 %   ? Psych/Spiritual % Change 6.04 %   ? Family Pre 30 %   ? Family Post 30 %   ? Family % Change 0 %   ? GLOBAL Pre 25.73 %   ? GLOBAL Post 28.93 %   ? GLOBAL % Change 12.44 %   ? ?  ?  ? ?  ? ? ? ?Nutrition & Weight - Outcomes: ? Pre Biometrics - 11/26/21 1114   ? ?  ? Pre Biometrics  ? Height 5' 9.8" (1.773 m)   ? Weight 197 lb  6.4 oz (89.5 kg)   ? BMI (Calculated) 28.48   ? Single Leg Stand 1.6 seconds   ? ?  ?  ? ?  ? ? Post Biometrics - 03/28/22 1018   ? ?  ?  Post  Biometrics  ? Height 5' 9.8" (1.773 m)   ? Weight 191 lb 12.8 oz (87 kg)   ? BMI (Calculated) 27.68   ? Single Leg Stand 5.5 seconds   ? ?  ?  ? ?  ? ? ?Nutrition: ? Nutrition Therapy & Goals - 12/31/21 1001   ? ?  ? Nutrition Therapy  ? Diet Heart healthy, low Na, T2DM friendly   ? Drug/Food Interactions Statins/Certain Fruits   ? Protein (specify units) 70g   ? Fiber 30 grams   ? Whole Grain Foods 3 servings   ? Saturated Fats 12 max. grams   ? Fruits and Vegetables 8 servings/day   ? Sodium 2 grams   ?  ? Personal Nutrition Goals  ? Nutrition Goal ST: consider trying daves killer bread for his whole grains, adding healthy  fats with fruit like peanut butter with grapes LT: maintain A1C <7, limit Na <2g/day, make at least 1/2 grains whole grains   ? Comments 67 y.o. M admitted to [place] rehab s/p stent placement. PMHx includes. PSHx includes CAD, T2DM (A1C 12/19/21: 7.1), dyspnea on exertion, dyspepsia, HTN. Relevant medications include fibercon, cymbalta, jardiance, glimepiride, protonix, crestor, trazadone. Subjective: He reports lowering sugar intake. B: egg sandwich (white bread) L: burger or something out, sandwich at home like pimento cheese D: lean meats and salads a couple of times per week. He reports eating fruit more at snacks and meals - he doesn't snack often. Drinks: water, coke occasionally. He will cook with olive oil, goes out to eat <1x/week, uses some salt after cooking. He reports having nutrition education at [facility] since he was 1. PYP Score: 62. Vegetables & Fruits 9/12. Breads, Grains & Cereals 6/12. Red & Processed Meat 10/12. Poultry 0/2. Fish & Shellfish 1/4. Beans, Nuts & Seeds 0/4. Milk & Dairy Foods 4/6. Toppings, Oils, Seasonings & Salt 13/20. Sweets, Snacks & Restaurant Food 12/14. Beverages 7/10. Discussed diabetes and heart healthy nutrition. Suggested switching to whole wheat bread and adding in heart healthy fats like nuts and seeds with his fruit.   ?  ? Intervention Plan  ? Intervention Prescribe, educate and counsel regarding individualized specific dietary modifications aiming towards targeted core components such as weight, hypertension, lipid management, diabetes, heart failure and other comorbidities.   ? Expected Outcomes Short Term Goal: Understand basic principles of dietary content, such as calories, fat, sodium, cholesterol and nutrients.;Short Term Goal: A plan has been developed with personal nutrition goals set during dietitian appointment.;Long Term Goal: Adherence to prescribed nutrition plan.   ? ?  ?  ? ?  ? ? ?Nutrition Discharge: ? ? ?Education Questionnaire Score: ?  Knowledge Questionnaire Score - 04/11/22 1055   ? ?  ? Knowledge Questionnaire Score  ? Pre Score 20/26   ? Post Score 24/26   ? ?  ?  ? ?  ? ? ?Goals reviewed with patient; copy given to patient. ?

## 2022-04-11 NOTE — Progress Notes (Signed)
Daily Session Note ? ?Patient Details  ?Name: Nicholas Knapp ?MRN: [id number] ?Date of Birth: 02/11/1956 ?Referring Provider:   ?Flowsheet Row Cardiac Rehab from 11/26/2021 in [facility] Cardiac [facility] Pul[facility] Re[facility]  ?Referring Provider Cloretta Ned MD  ? ?  ? ? ?Encounter Date: 04/11/2022 ? ?Check In: ? Session Check In - 04/11/22 1050   ? ?  ? Check-In  ? Supervising physician immediately available to respond to emergencies See telemetry face sheet for immediately available ER MD   ? Location [facility]-Cardiac & Pulmonary Rehab   ? Staff Present Birdie Sons, MPA, RN;Melissa Woodson, RDN, LDN;Laureen Owens Shark, BS, RRT, CPFT   ? Virtual Visit No   ? Medication changes reported     No   ? Fall or balance concerns reported    No   ? Tobacco Cessation No Change   ? Warm-up and Cool-down Performed on first and last piece of equipment   ? Resistance Training Performed Yes   ? VAD Patient? No   ? PAD/SET Patient? No   ?  ? Pain Assessment  ? Currently in Pain? No/denies   ? ?  ?  ? ?  ? ? ? ? ? ?Social History  ? ?Tobacco Use  ?Smoking Status Former  ? Packs/day: 1.00  ? Years: 25.00  ? Pack years: 25.00  ? Types: Cigarettes  ? Quit date: 09/23/1994  ? Years since quitting: 27.5  ?Smokeless Tobacco Never  ? ? ?Goals Met:  ?Independence with exercise equipment ?Exercise tolerated well ?No report of concerns or symptoms today ?Strength training completed today ? ?Goals Unmet:  ?Not Applicable ? ?Comments:  Nicholas Knapp graduated today from  rehab with 36 sessions completed.  Details of the patient's exercise prescription and what He needs to do in order to continue the prescription and progress were discussed with patient.  Patient was given a copy of prescription and goals.  Patient verbalized understanding.  Nicholas Knapp plans to continue to exercise by walking at home. ? ? ? ? ? ?Dr. Emily Filbert is Medical Director for [place].  ?Dr. Ottie Glazier is Medical Director for [facility] Pulmonary Rehabilitation. ?

## 2022-05-29 ENCOUNTER — Other Ambulatory Visit: Payer: Self-pay | Admitting: Internal Medicine

## 2022-05-29 DIAGNOSIS — I6523 Occlusion and stenosis of bilateral carotid arteries: Secondary | ICD-10-CM

## 2022-06-04 ENCOUNTER — Ambulatory Visit
Admission: RE | Admit: 2022-06-04 | Discharge: 2022-06-04 | Disposition: A | Discharge status: Home / Self Care | Payer: Medicare Other | Source: Ambulatory Visit | Attending: Internal Medicine | Admitting: Internal Medicine

## 2022-06-04 DIAGNOSIS — I6523 Occlusion and stenosis of bilateral carotid arteries: Secondary | ICD-10-CM

## 2022-11-02 ENCOUNTER — Other Ambulatory Visit: Payer: Self-pay | Admitting: Student in an Organized Health Care Education/Training Program

## 2022-11-02 DIAGNOSIS — M5416 Radiculopathy, lumbar region: Secondary | ICD-10-CM

## 2022-11-29 ENCOUNTER — Encounter: Payer: Self-pay | Admitting: Internal Medicine

## 2022-12-04 ENCOUNTER — Ambulatory Visit: Payer: Medicare Other | Admitting: Registered Nurse

## 2022-12-04 ENCOUNTER — Encounter
Admission: RE | Disposition: A | Discharge status: Home / Self Care | Payer: Self-pay | Source: Home / Self Care | Attending: Internal Medicine

## 2022-12-04 ENCOUNTER — Ambulatory Visit
Admission: RE | Admit: 2022-12-04 | Discharge: 2022-12-04 | Disposition: A | Discharge status: Home / Self Care | Payer: Medicare Other | Attending: Internal Medicine | Admitting: Internal Medicine

## 2022-12-04 ENCOUNTER — Encounter: Payer: Self-pay | Admitting: Internal Medicine

## 2022-12-04 DIAGNOSIS — K641 Second degree hemorrhoids: Secondary | ICD-10-CM | POA: Insufficient documentation

## 2022-12-04 DIAGNOSIS — G4730 Sleep apnea, unspecified: Secondary | ICD-10-CM | POA: Insufficient documentation

## 2022-12-04 DIAGNOSIS — Z7902 Long term (current) use of antithrombotics/antiplatelets: Secondary | ICD-10-CM | POA: Insufficient documentation

## 2022-12-04 DIAGNOSIS — J45909 Unspecified asthma, uncomplicated: Secondary | ICD-10-CM | POA: Diagnosis not present

## 2022-12-04 DIAGNOSIS — Z980 Intestinal bypass and anastomosis status: Secondary | ICD-10-CM | POA: Insufficient documentation

## 2022-12-04 DIAGNOSIS — Z7984 Long term (current) use of oral hypoglycemic drugs: Secondary | ICD-10-CM | POA: Diagnosis not present

## 2022-12-04 DIAGNOSIS — F419 Anxiety disorder, unspecified: Secondary | ICD-10-CM | POA: Insufficient documentation

## 2022-12-04 DIAGNOSIS — K5730 Diverticulosis of large intestine without perforation or abscess without bleeding: Secondary | ICD-10-CM | POA: Diagnosis not present

## 2022-12-04 DIAGNOSIS — E119 Type 2 diabetes mellitus without complications: Secondary | ICD-10-CM | POA: Insufficient documentation

## 2022-12-04 DIAGNOSIS — F32A Depression, unspecified: Secondary | ICD-10-CM | POA: Diagnosis not present

## 2022-12-04 DIAGNOSIS — Z79899 Other long term (current) drug therapy: Secondary | ICD-10-CM | POA: Diagnosis not present

## 2022-12-04 DIAGNOSIS — Z1211 Encounter for screening for malignant neoplasm of colon: Secondary | ICD-10-CM | POA: Insufficient documentation

## 2022-12-04 DIAGNOSIS — K2970 Gastritis, unspecified, without bleeding: Secondary | ICD-10-CM | POA: Diagnosis not present

## 2022-12-04 DIAGNOSIS — I252 Old myocardial infarction: Secondary | ICD-10-CM | POA: Insufficient documentation

## 2022-12-04 DIAGNOSIS — I10 Essential (primary) hypertension: Secondary | ICD-10-CM | POA: Insufficient documentation

## 2022-12-04 DIAGNOSIS — E785 Hyperlipidemia, unspecified: Secondary | ICD-10-CM | POA: Diagnosis not present

## 2022-12-04 DIAGNOSIS — I2510 Atherosclerotic heart disease of native coronary artery without angina pectoris: Secondary | ICD-10-CM | POA: Insufficient documentation

## 2022-12-04 DIAGNOSIS — K219 Gastro-esophageal reflux disease without esophagitis: Secondary | ICD-10-CM | POA: Diagnosis not present

## 2022-12-04 DIAGNOSIS — Z83719 Family history of colon polyps, unspecified: Secondary | ICD-10-CM | POA: Insufficient documentation

## 2022-12-04 HISTORY — PX: COLONOSCOPY WITH PROPOFOL: SHX5780

## 2022-12-04 HISTORY — PX: ESOPHAGOGASTRODUODENOSCOPY (EGD) WITH PROPOFOL: SHX5813

## 2022-12-04 LAB — GLUCOSE, CAPILLARY: Glucose-Capillary: 107 mg/dL — ABNORMAL HIGH (ref 70–99)

## 2022-12-04 SURGERY — COLONOSCOPY WITH PROPOFOL
Anesthesia: General

## 2022-12-04 MED ORDER — LIDOCAINE HCL (CARDIAC) PF 100 MG/5ML IV SOSY
PREFILLED_SYRINGE | INTRAVENOUS | Status: DC | PRN
  Administered 2022-12-04: 100 mg via INTRAVENOUS

## 2022-12-04 MED ORDER — SODIUM CHLORIDE 0.9 % IV SOLN
INTRAVENOUS | Status: DC
  Administered 2022-12-04: 1000 mL via INTRAVENOUS

## 2022-12-04 MED ORDER — EPHEDRINE SULFATE (PRESSORS) 50 MG/ML IJ SOLN
INTRAMUSCULAR | Status: DC | PRN
  Administered 2022-12-04 (×2): 5 mg via INTRAVENOUS
  Administered 2022-12-04: 15 mg via INTRAVENOUS

## 2022-12-04 MED ORDER — PROPOFOL 10 MG/ML IV BOLUS
INTRAVENOUS | Status: DC | PRN
  Administered 2022-12-04: 90 mg via INTRAVENOUS
  Administered 2022-12-04: 30 mg via INTRAVENOUS

## 2022-12-04 MED ORDER — PROPOFOL 500 MG/50ML IV EMUL
INTRAVENOUS | Status: DC | PRN
  Administered 2022-12-04: 208.333 ug/kg/min via INTRAVENOUS

## 2022-12-04 NOTE — Anesthesia Preprocedure Evaluation (Signed)
Anesthesia Evaluation  Patient identified by MRN, date of birth, ID band Patient awake    Reviewed: Allergy & Precautions, NPO status , Patient's Chart, lab work & pertinent test results  History of Anesthesia Complications Negative for: history of anesthetic complications  Airway Mallampati: III  TM Distance: >3 FB Neck ROM: full    Dental no notable dental hx. (+) Dental Advidsory Given   Pulmonary sleep apnea and Continuous Positive Airway Pressure Ventilation , former smoker   Pulmonary exam normal breath sounds clear to auscultation- rhonchi (-) wheezing      Cardiovascular hypertension, Pt. on medications (-) angina + CAD, + Past MI and + CABG  Normal cardiovascular exam+ Valvular Problems/Murmurs  Rhythm:Regular Rate:Normal - Systolic murmurs and - Diastolic murmurs    Neuro/Psych   Anxiety Depression    negative neurological ROS  negative psych ROS   GI/Hepatic negative GI ROS, Neg liver ROS,,,  Endo/Other  negative endocrine ROSdiabetes, Oral Hypoglycemic Agents    Renal/GU negative Renal ROS  negative genitourinary   Musculoskeletal negative musculoskeletal ROS (+)    Abdominal  (+) - obese  Peds  Hematology negative hematology ROS (+)   Anesthesia Other Findings Past Medical History: No date: Allergic state No date: Anxiety No date: Asthma No date: Coronary artery disease No date: Depression No date: Diabetes mellitus without complication (HCC) No date: Diverticulosis No date: GERD (gastroesophageal reflux disease) No date: Hyperlipemia No date: Hypertension No date: Midsystolic murmur 1995 and 2008: Myocardial infarction (HCC) No date: Prostatitis No date: Proteinuria No date: Sleep apnea  Past Surgical History: 2008: COLON SURGERY     Comment:  6 inches removed due to diverticulosis 09/13/2016: COLONOSCOPY WITH PROPOFOL; N/A     Comment:  Procedure: COLONOSCOPY WITH PROPOFOL;  Surgeon:  Scot Jun, MD;  Location: [facility] ENDOSCOPY;  Service:               Endoscopy;  Laterality: N/A; No date: CORONARY ANGIOPLASTY     Comment:  6 stents on 2 separate procedures 09/28/1994: CORONARY ARTERY BYPASS GRAFT     Comment:  2 vessels No date: double bipass 11/19/2013: ESOPHAGOGASTRODUODENOSCOPY No date: sinus sugery 03/26/2018: TENDON RECONSTRUCTION; Right     Comment:  Procedure: DEBRIDEMENT OF THE COMMON EXTENSOR ORIGIN OF               ELBOW;  Surgeon: Christena Flake, MD;  Location: [facility] ORS;               Service: Orthopedics;  Laterality: Right; No date: TONSILLECTOMY  BMI    Body Mass Index: 27.36 kg/m      Reproductive/Obstetrics negative OB ROS                             Anesthesia Physical Anesthesia Plan  ASA: 3  Anesthesia Plan: General   Post-op Pain Management:    Induction: Intravenous  PONV Risk Score and Plan: 2 and Propofol infusion and TIVA  Airway Management Planned: Natural Airway and Nasal Cannula  Additional Equipment:   Intra-op Plan:   Post-operative Plan:   Informed Consent: I have reviewed the patients History and Physical, chart, labs and discussed the procedure including the risks, benefits and alternatives for the proposed anesthesia with the patient or authorized representative who has indicated his/her understanding and acceptance.     Dental Advisory Given  Plan Discussed  with: Anesthesiologist, CRNA and Surgeon  Anesthesia Plan Comments: (Patient consented for risks of anesthesia including but not limited to:  - adverse reactions to medications - risk of airway placement if required - damage to eyes, teeth, lips or other oral mucosa - nerve damage due to positioning  - sore throat or hoarseness - Damage to heart, brain, nerves, lungs, other parts of body or loss of life  Patient voiced understanding.)        Anesthesia Quick Evaluation

## 2022-12-04 NOTE — Interval H&P Note (Signed)
History and Physical Interval Note:  12/04/2022 8:53 AM  Nicholas Knapp  has presented today for surgery, with the diagnosis of Z79.02 - Antiplatelet or antithrombotic long-term use Z83.719  - Family history of polyps in the colon K21.9  - Gastroesophageal reflux disease, unspecified whether esophagitis present.  The various methods of treatment have been discussed with the patient and family. After consideration of risks, benefits and other options for treatment, the patient has consented to  Procedure(s): COLONOSCOPY WITH PROPOFOL (N/A) ESOPHAGOGASTRODUODENOSCOPY (EGD) WITH PROPOFOL (N/A) as a surgical intervention.  [name] history has been reviewed, patient examined, no change in status, stable for surgery.  I have reviewed the patient's chart and labs.  Questions were answered to the patient's satisfaction.     [place], [place]

## 2022-12-04 NOTE — H&P (Signed)
Outpatient short stay form Pre-procedure 12/04/2022 8:51 AM Nicholas Knapp, M.D.  Primary Physician: Daniel Nones III, M.D.  Reason for visit:  GERD, Family history of colon polyps  History of present illness:     Family history of colon polyps-reports both parents had colon polyps. Due for 5-year repeat colonoscopy. No lower GI alarm symptoms.  GERD-mild symptoms despite PPI 40 mg once a day and significant symptoms without PPI. Last EGD was over 20 years ago. No frequent dysphagia. He would like to add on EGD at time of colonoscopy and can exclude Barrett's esophagus, etc. Reflux modifications reviewed.   Medications Prior to Admission  Medication Sig Dispense Refill Last Dose   aspirin 81 MG tablet Take 81 mg by mouth every evening.    12/03/2022   CALCIUM POLYCARBOPHIL PO Take 1 tablet by mouth daily.   12/03/2022   DULoxetine (CYMBALTA) 60 MG capsule Take 60 mg by mouth daily.   12/04/2022   Empagliflozin (JARDIANCE PO) Take 20 mg by mouth daily.   12/03/2022   glimepiride (AMARYL) 2 MG tablet Take 2 mg by mouth daily with breakfast.   12/03/2022   glucose blood test strip 1 each by Other route as needed for other. Use as instructed   12/03/2022   JARDIANCE 25 MG TABS tablet Take 25 mg by mouth daily.   12/04/2022 at 0600   metoprolol succinate (TOPROL-XL) 25 MG 24 hr tablet Take 25 mg by mouth every evening.    12/03/2022   pantoprazole (PROTONIX) 40 MG tablet Take 1 tablet by mouth at bedtime.   12/03/2022   rosuvastatin (CRESTOR) 20 MG tablet Take 20 mg by mouth every evening.    12/03/2022   azelastine (ASTELIN) 0.1 % nasal spray Place into the nose.      clopidogrel (PLAVIX) 75 MG tablet Take 75 mg by mouth daily. (Patient not taking: Reported on 12/04/2022)   Completed Course   clopidogrel (PLAVIX) 75 MG tablet Take 1 tablet by mouth daily. (Patient not taking: Reported on 11/08/2021)      diphenhydrAMINE (BENADRYL) 25 MG tablet Take 25 mg by mouth daily as needed for  allergies.    at prn   DULoxetine (CYMBALTA) 60 MG capsule Take 1 capsule by mouth daily. (Patient not taking: Reported on 11/08/2021)      EPINEPHrine 0.3 mg/0.3 mL IJ SOAJ injection Inject into the muscle once.    at prn   gabapentin (NEURONTIN) 600 MG tablet Take 1 tablet (600 mg total) by mouth every 8 (eight) hours. 90 tablet 2  at prn   gabapentin (NEURONTIN) 600 MG tablet 2 (two) times daily (Patient not taking: Reported on 11/08/2021)      losartan (COZAAR) 100 MG tablet Take 100 mg by mouth daily. (Patient not taking: Reported on 12/04/2022)   Not Taking   losartan (COZAAR) 25 MG tablet Take 25 mg by mouth every evening.  (Patient not taking: Reported on 11/08/2021)      nitroGLYCERIN (NITROSTAT) 0.4 MG SL tablet Place 0.4 mg under the tongue every 5 (five) minutes as needed for chest pain.    at prn   pantoprazole (PROTONIX) 40 MG tablet Take 40 mg by mouth daily.       polycarbophil (FIBERCON) 625 MG tablet Take 625 mg by mouth every evening.      spironolactone (ALDACTONE) 25 MG tablet Take by mouth.      STUDY - ASPIRE - aspirin 81 mg or placebo tablet (PI-Sethi) Take 1 tablet by mouth  every evening. (Patient not taking: Reported on 12/04/2022)   Completed Course   traZODone (DESYREL) 50 MG tablet Take 50 mg by mouth at bedtime as needed for sleep.    at prn     Allergies  Allergen Reactions   Bee Venom Anaphylaxis    Bee / wasp sting   Ace Inhibitors Cough   Isosorbide     Headaches   Lamotrigine     sedation   Metformin And Related Diarrhea   Bupropion Palpitations   Crestor [Rosuvastatin Calcium]     Muscle pain, currently taking, tolerates pain     Past Medical History:  Diagnosis Date   Allergic state    Anxiety    Asthma    Coronary artery disease    Depression    Diabetes mellitus without complication (HCC)    Diverticulosis    GERD (gastroesophageal reflux disease)    Hyperlipemia    Hypertension    Midsystolic murmur    Myocardial infarction (HCC) 1995  and 2008   Prostatitis    Proteinuria    Sleep apnea     Review of systems:  Otherwise negative.    Physical Exam  Gen: Alert, oriented. Appears stated age.  HEENT: /AT. PERRLA. Lungs: CTA, no wheezes. CV: RR nl S1, S2. Abd: soft, benign, no masses. BS+ Ext: No edema. Pulses 2+    Planned procedures: Proceed with EGD and colonoscopy. The patient understands the nature of the planned procedure, indications, risks, alternatives and potential complications including but not limited to bleeding, infection, perforation, damage to internal organs and possible oversedation/side effects from anesthesia. The patient agrees and gives consent to proceed.  Please refer to procedure notes for findings, recommendations and patient disposition/instructions.     Isha Seefeld K. Norma Knapp, M.D. Gastroenterology 12/04/2022  8:51 AM

## 2022-12-04 NOTE — Op Note (Signed)
[facility] Gastroenterology Patient Name: Nicholas Knapp Procedure Date: 12/04/2022 8:53 AM MRN: [id number] Account #: [id number] Date of Birth: Jun 06, 1956 Admit Type: Outpatient Age: 66 Room: [facility] ENDO ROOM 2 Gender: Male Note Status: Finalized Instrument Name: Upper Endoscope [id number] Procedure:             Upper GI endoscopy Indications:           Esophageal reflux, Failure to respond to medical                         treatment Providers:             Benay Pike. Alice Reichert MD, MD Referring MD:          Ramonita Lab, MD (Referring MD) Medicines:             Propofol per Anesthesia Complications:         No immediate complications. Procedure:             Pre-Anesthesia Assessment:                        - The risks and benefits of the procedure and the                         sedation options and risks were discussed with the                         patient. All questions were answered and informed                         consent was obtained.                        - Patient identification and proposed procedure were                         verified prior to the procedure by the nurse. The                         procedure was verified in the procedure room.                        - ASA Grade Assessment: III - A patient with severe                         systemic disease.                        - After reviewing the risks and benefits, the patient                         was deemed in satisfactory condition to undergo the                         procedure.                        After obtaining informed consent, the endoscope was                         passed under  direct vision. Throughout the procedure,                         the patient's blood pressure, pulse, and oxygen                         saturations were monitored continuously. The Endoscope                         was introduced through the mouth, and advanced to the                         third part  of duodenum. The upper GI endoscopy was                         accomplished without difficulty. The patient tolerated                         the procedure well. Findings:      The examined esophagus was normal. Biopsies were obtained from the       proximal and distal esophagus with cold forceps for histology of       suspected eosinophilic esophagitis.      The Z-line was regular and was found at the gastroesophageal junction.      Patchy minimal inflammation characterized by erythema was found in the       gastric antrum.      The cardia and gastric fundus were normal on retroflexion.      The examined duodenum was normal.      The exam was otherwise without abnormality. Impression:            - Normal esophagus.                        - Z-line regular, at the gastroesophageal junction.                        - Gastritis.                        - Normal examined duodenum.                        - The examination was otherwise normal.                        - Biopsies were taken with a cold forceps for                         evaluation of eosinophilic esophagitis. Recommendation:        - Await pathology results.                        - Proceed with colonoscopy Procedure Code(s):     --- Professional ---                        [phone], Esophagogastroduodenoscopy, flexible,                         transoral; with biopsy, single or multiple Diagnosis Code(s):     ---  Professional ---                        K21.9, Gastro-esophageal reflux disease without                         esophagitis                        K29.70, Gastritis, unspecified, without bleeding CPT copyright 2022 American Medical Association. All rights reserved. The codes documented in this report are preliminary and upon coder review may  be revised to meet current compliance requirements. Stanton Kidney MD, MD 12/04/2022 9:15:39 AM This report has been signed electronically. Number of Addenda: 0 Note Initiated  On: 12/04/2022 8:53 AM Estimated Blood Loss:  Estimated blood loss: none.      [facility]

## 2022-12-04 NOTE — Op Note (Signed)
[facility] Gastroenterology Patient Name: Nicholas Knapp Procedure Date: 12/04/2022 8:52 AM MRN: [id number] Account #: [id number] Date of Birth: 11/29/1956 Admit Type: Outpatient Age: 66 Room: [facility] ENDO ROOM 2 Gender: Male Note Status: Finalized Instrument Name: Nelda Marseille [id number] Procedure:             Colonoscopy Indications:           Colon cancer screening in patient at increased risk:                         Family history of colon polyps in multiple 1st-degree                         relatives Providers:             Boykin Nearing. Norma Fredrickson MD, MD Referring MD:          Daniel Nones, MD (Referring MD) Medicines:             Propofol per Anesthesia Complications:         No immediate complications. Procedure:             Pre-Anesthesia Assessment:                        - The risks and benefits of the procedure and the                         sedation options and risks were discussed with the                         patient. All questions were answered and informed                         consent was obtained.                        - Patient identification and proposed procedure were                         verified prior to the procedure by the nurse. The                         procedure was verified in the procedure room.                        - ASA Grade Assessment: III - A patient with severe                         systemic disease.                        - After reviewing the risks and benefits, the patient                         was deemed in satisfactory condition to undergo the                         procedure.                        After obtaining informed  consent, the colonoscope was                         passed under direct vision. Throughout the procedure,                         the patient's blood pressure, pulse, and oxygen                         saturations were monitored continuously. The                         Colonoscope was introduced  through the anus and                         advanced to the the cecum, identified by appendiceal                         orifice and ileocecal valve. The colonoscopy was                         performed without difficulty. The patient tolerated                         the procedure well. The quality of the bowel                         preparation was fair. The ileocecal valve, appendiceal                         orifice, and rectum were photographed. Findings:      The perianal and digital rectal examinations were normal. Pertinent       negatives include normal sphincter tone and no palpable rectal lesions.      Non-bleeding internal hemorrhoids were found during retroflexion. The       hemorrhoids were Grade II (internal hemorrhoids that prolapse but reduce       spontaneously).      There was evidence of a prior end-to-end colo-colonic anastomosis in the       recto-sigmoid colon. This was patent and was characterized by healthy       appearing mucosa. The anastomosis was traversed.      Multiple large-mouthed and medium-mouthed diverticula were found in the       transverse colon and left colon.      The exam was otherwise without abnormality. Impression:            - Preparation of the colon was fair.                        - Non-bleeding internal hemorrhoids.                        - Patent end-to-end colo-colonic anastomosis,                         characterized by healthy appearing mucosa.                        - Diverticulosis in the transverse colon and in the  left colon.                        - The examination was otherwise normal.                        - No specimens collected. Recommendation:        - Patient has a contact number available for                         emergencies. The signs and symptoms of potential                         delayed complications were discussed with the patient.                         Return to normal activities  tomorrow. Written                         discharge instructions were provided to the patient.                        - Resume previous diet.                        - Continue present medications.                        - Repeat colonoscopy in 5 years for screening purposes.                        - Await pathology results from EGD, also performed                         today.                        - Return to physician assistant in 3 months.                        - Follow up with Tawni Pummel, PA-C at [facility]astroenter[facility]. [phone].                        - Telephone GI office to schedule appointment.                        - The findings and recommendations were discussed with                         the patient. Procedure Code(s):     --- Professional ---                        X7939, Colorectal cancer screening; colonoscopy on                         individual at high risk Diagnosis Code(s):     --- Professional ---  K57.30, Diverticulosis of large intestine without                         perforation or abscess without bleeding                        Z98.0, Intestinal bypass and anastomosis status                        K64.1, Second degree hemorrhoids                        Z83.71, Family history of colonic polyps CPT copyright 2022 American Medical Association. All rights reserved. The codes documented in this report are preliminary and upon coder review may  be revised to meet current compliance requirements. Stanton Kidneyeodoro K Tip Atienza MD, MD 12/04/2022 9:33:52 AM This report has been signed electronically. Number of Addenda: 0 Note Initiated On: 12/04/2022 8:52 AM Scope Withdrawal Time: 0 hours 4 minutes 46 seconds  Total Procedure Duration: 0 hours 10 minutes 51 seconds  Estimated Blood Loss:  Estimated blood loss: none.      [facility]

## 2022-12-04 NOTE — Transfer of Care (Signed)
Immediate Anesthesia Transfer of Care Note  Patient: Nicholas Knapp  Procedure(s) Performed: COLONOSCOPY WITH PROPOFOL ESOPHAGOGASTRODUODENOSCOPY (EGD) WITH PROPOFOL  Patient Location: Endoscopy Unit  Anesthesia Type:General  Level of Consciousness: drowsy  Airway & Oxygen Therapy: Patient Spontanous Breathing  Post-op Assessment: Report given to RN and Post -op Vital signs reviewed and stable  Post vital signs: Reviewed and stable  Last Vitals:  Vitals Value Taken Time  BP 98/61 12/04/22 0932  Temp 35.9 C 12/04/22 0932  Pulse 66 12/04/22 0932  Resp 13 12/04/22 0932  SpO2 96 % 12/04/22 0932  Vitals shown include unvalidated device data.  Last Pain:  Vitals:   12/04/22 0932  TempSrc: Tympanic  PainSc: Asleep         Complications: No notable events documented.

## 2022-12-04 NOTE — Anesthesia Postprocedure Evaluation (Signed)
Anesthesia Post Note  Patient: Nicholas Knapp  Procedure(s) Performed: COLONOSCOPY WITH PROPOFOL ESOPHAGOGASTRODUODENOSCOPY (EGD) WITH PROPOFOL  Patient location during evaluation: Endoscopy Anesthesia Type: General Level of consciousness: awake and alert Pain management: pain level controlled Vital Signs Assessment: post-procedure vital signs reviewed and stable Respiratory status: spontaneous breathing, nonlabored ventilation, respiratory function stable and patient connected to nasal cannula oxygen Cardiovascular status: blood pressure returned to baseline and stable Postop Assessment: no apparent nausea or vomiting Anesthetic complications: no  No notable events documented.   Last Vitals:  Vitals:   12/04/22 0942 12/04/22 0952  BP: 106/65 113/62  Pulse: 71 69  Resp: 20 20  Temp:    SpO2: 99% 99%    Last Pain:  Vitals:   12/04/22 0952  TempSrc:   PainSc: 0-No pain                 Stephanie Coup

## 2022-12-05 ENCOUNTER — Encounter: Payer: Self-pay | Admitting: Internal Medicine

## 2022-12-05 LAB — SURGICAL PATHOLOGY

## 2023-08-20 ENCOUNTER — Ambulatory Visit
Admission: EM | Admit: 2023-08-20 | Discharge: 2023-08-20 | Disposition: A | Discharge status: Home / Self Care | Payer: Medicare Other

## 2023-08-20 DIAGNOSIS — R21 Rash and other nonspecific skin eruption: Secondary | ICD-10-CM

## 2023-08-20 DIAGNOSIS — M25421 Effusion, right elbow: Secondary | ICD-10-CM

## 2023-08-20 DIAGNOSIS — T63461A Toxic effect of venom of wasps, accidental (unintentional), initial encounter: Secondary | ICD-10-CM | POA: Diagnosis not present

## 2023-08-20 MED ORDER — PREDNISONE 10 MG (21) PO TBPK: ORAL_TABLET | Freq: Every day | ORAL | 0 refills | Status: DC

## 2023-08-20 NOTE — ED Provider Notes (Signed)
Nicholas Knapp    CSN: [id number] Arrival date & time: 08/20/23  1803      History   Chief Complaint Chief Complaint  Patient presents with   Insect Bite    HPI Nicholas Knapp is a 67 y.o. male.  Patient presents with redness, pain, swelling of his right elbow x 1 day after he was accidentally stung by a yellow jacket yesterday.  He took Benadryl twice today.  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  He did not require treatment with his EpiPen for his current yellowjacket sting.  He denies difficulty swallowing or breathing.  Patient was seen at [facility] clinic on 07/28/2023 for a wasp sting on his left hand; treated with prednisone taper, Benadryl, Pepcid.  The history is provided by the patient and medical records.    Past Medical History:  Diagnosis Date   Allergic state    Anxiety    Asthma    Coronary artery disease    Depression    Diabetes mellitus without complication (HCC)    Diverticulosis    GERD (gastroesophageal reflux disease)    Hyperlipemia    Hypertension    Midsystolic murmur    Myocardial infarction (HCC) 1995 and 2008   Prostatitis    Proteinuria    Sleep apnea     Patient Active Problem List   Diagnosis Date Noted   CAD (coronary artery disease), autologous vein bypass graft 08/23/2020   Neuroforaminal stenosis of lumbar spine 08/23/2020   Acute right-sided low back pain with right-sided sciatica 07/27/2020   Lumbar radicular pain 07/27/2020   S/P drug eluting coronary stent placement 07/10/2020   Lumbar facet joint syndrome 01/21/2020   Rotator cuff tendinitis, right 01/20/2019   Strain of right shoulder 01/20/2019   Lateral epicondylitis, left elbow 03/18/2018   Type 2 diabetes mellitus with microalbuminuria (HCC) 06/02/2014   Coronary artery disease involving native coronary artery of native heart with angina pectoris (HCC) 05/20/2012    Past Surgical History:  Procedure Laterality Date    COLON SURGERY  2008   6 inches removed due to diverticulosis   COLONOSCOPY WITH PROPOFOL N/A 09/13/2016   Procedure: COLONOSCOPY WITH PROPOFOL;  Surgeon: Scot Jun, MD;  Location: [facility] ENDOSCOPY;  Service: Endoscopy;  Laterality: N/A;   COLONOSCOPY WITH PROPOFOL N/A 12/04/2022   Procedure: COLONOSCOPY WITH PROPOFOL;  Surgeon: Toledo, Boykin Nearing, MD;  Location: [facility] ENDOSCOPY;  Service: Gastroenterology;  Laterality: N/A;   CORONARY ANGIOPLASTY     6 stents on 2 separate procedures   CORONARY ARTERY BYPASS GRAFT  09/28/1994   2 vessels   double bipass     ESOPHAGOGASTRODUODENOSCOPY  11/19/2013   ESOPHAGOGASTRODUODENOSCOPY (EGD) WITH PROPOFOL N/A 12/04/2022   Procedure: ESOPHAGOGASTRODUODENOSCOPY (EGD) WITH PROPOFOL;  Surgeon: Toledo, Boykin Nearing, MD;  Location: [facility] ENDOSCOPY;  Service: Gastroenterology;  Laterality: N/A;   sinus sugery     TENDON RECONSTRUCTION Right 03/26/2018   Procedure: DEBRIDEMENT OF THE COMMON EXTENSOR ORIGIN OF ELBOW;  Surgeon: Christena Flake, MD;  Location: [facility] ORS;  Service: Orthopedics;  Laterality: Right;   TONSILLECTOMY         Home Medications    Prior to Admission medications   Medication Sig Start Date End Date Taking? Authorizing Provider  predniSONE (STERAPRED UNI-PAK 21 TAB) 10 MG (21) TBPK tablet Take by mouth daily. As directed 08/20/23  Yes Mickie Bail, NP  aspirin 81 MG tablet Take 81 mg by mouth every evening.  [provider]  azelastine (ASTELIN) 0.1 % nasal spray Place into the nose. 04/30/21 04/30/22  [provider]  CALCIUM POLYCARBOPHIL PO Take 1 tablet by mouth daily.    [provider]  clopidogrel (PLAVIX) 75 MG tablet Take 75 mg by mouth daily. Patient not taking: Reported on 12/04/2022 09/25/20   [provider]  clopidogrel (PLAVIX) 75 MG tablet Take 1 tablet by mouth daily. Patient not taking: Reported on 11/08/2021 06/28/21   [provider]  diphenhydrAMINE (BENADRYL) 25 MG tablet  Take 25 mg by mouth daily as needed for allergies.    [provider]  DULoxetine (CYMBALTA) 60 MG capsule Take 60 mg by mouth daily.    [provider]  DULoxetine (CYMBALTA) 60 MG capsule Take 1 capsule by mouth daily. Patient not taking: Reported on 11/08/2021 07/31/21   [provider]  Empagliflozin (JARDIANCE PO) Take 20 mg by mouth daily.    [provider]  EPINEPHrine 0.3 mg/0.3 mL IJ SOAJ injection Inject into the muscle once.    [provider]  ezetimibe (ZETIA) 10 MG tablet Take 10 mg by mouth daily.    [provider]  gabapentin (NEURONTIN) 600 MG tablet Take 1 tablet (600 mg total) by mouth every 8 (eight) hours. 09/27/21   Edward Jolly, MD  gabapentin (NEURONTIN) 600 MG tablet 2 (two) times daily Patient not taking: Reported on 11/08/2021 10/24/21   [provider]  glimepiride (AMARYL) 2 MG tablet Take 2 mg by mouth daily with breakfast.    [provider]  glucose blood test strip 1 each by Other route as needed for other. Use as instructed    [provider]  JARDIANCE 25 MG TABS tablet Take 25 mg by mouth daily. 09/17/21   [provider]  losartan (COZAAR) 100 MG tablet Take 100 mg by mouth daily. Patient not taking: Reported on 12/04/2022 08/05/21   [provider]  losartan (COZAAR) 25 MG tablet Take 25 mg by mouth every evening.  Patient not taking: Reported on 11/08/2021    [provider]  metoprolol succinate (TOPROL-XL) 25 MG 24 hr tablet Take 25 mg by mouth every evening.     [provider]  nitroGLYCERIN (NITROSTAT) 0.4 MG SL tablet Place 0.4 mg under the tongue every 5 (five) minutes as needed for chest pain.    [provider]  OZEMPIC, 0.25 OR 0.5 MG/DOSE, 2 MG/3ML SOPN Inject into the skin.    [provider]  pantoprazole (PROTONIX) 40 MG tablet Take 40 mg by mouth daily.     [provider]  pantoprazole (PROTONIX) 40 MG  tablet Take 1 tablet by mouth at bedtime. 08/24/21   [provider]  polycarbophil (FIBERCON) 625 MG tablet Take 625 mg by mouth every evening.    [provider]  rosuvastatin (CRESTOR) 20 MG tablet Take 20 mg by mouth every evening.     [provider]  spironolactone (ALDACTONE) 25 MG tablet Take by mouth. 10/26/21 10/26/22  [provider]  STUDY - ASPIRE - aspirin 81 mg or placebo tablet (PI-Sethi) Take 1 tablet by mouth every evening. Patient not taking: Reported on 12/04/2022 07/13/08   [provider]  traZODone (DESYREL) 50 MG tablet Take 50 mg by mouth at bedtime as needed for sleep.    [provider]    Family History Family History  Problem Relation Age of Onset   Cancer Mother    Heart disease Mother  Cancer Father    Heart disease Father     Social History Social History   Tobacco Use   Smoking status: Former    Current packs/day: 0.00    Average packs/day: 1 pack/day for 25.0 years (25.0 ttl pk-yrs)    Types: Cigarettes    Start date: 09/23/1969    Quit date: 09/23/1994    Years since quitting: 28.9   Smokeless tobacco: Never  Vaping Use   Vaping status: Never Used  Substance Use Topics   Alcohol use: Yes    Comment: occassional   Drug use: No     Allergies   Bee venom, Ace inhibitors, Isosorbide, Lamotrigine, Metformin and related, Bupropion, and Crestor [rosuvastatin calcium]   Review of Systems Review of Systems  Constitutional:  Negative for chills and fever.  HENT:  Negative for sore throat, trouble swallowing and voice change.   Respiratory:  Negative for cough, shortness of breath and wheezing.   Cardiovascular:  Negative for chest pain and palpitations.  Musculoskeletal:  Positive for arthralgias and joint swelling.  Skin:  Positive for color change and wound.  Neurological:  Negative for weakness and numbness.     Physical Exam Triage Vital Signs ED Triage Vitals  Encounter Vitals  Group     BP      Systolic BP Percentile      Diastolic BP Percentile      Pulse      Resp      Temp      Temp src      SpO2      Weight      Height      Head Circumference      Peak Flow      Pain Score      Pain Loc      Pain Education      Exclude from Growth Chart    No data found.  Updated Vital Signs BP 125/71   Pulse 82   Temp 97.6 F (36.4 C)   Resp 18   SpO2 95%   Visual Acuity Right Eye Distance:   Left Eye Distance:   Bilateral Distance:    Right Eye Near:   Left Eye Near:    Bilateral Near:     Physical Exam Constitutional:      General: He is not in acute distress. HENT:     Mouth/Throat:     Mouth: Mucous membranes are moist.     Pharynx: Oropharynx is clear.  Cardiovascular:     Rate and Rhythm: Normal rate and regular rhythm.     Heart sounds: Normal heart sounds.  Pulmonary:     Effort: Pulmonary effort is normal. No respiratory distress.     Breath sounds: Normal breath sounds.  Musculoskeletal:        General: Swelling and tenderness present. No deformity. Normal range of motion.       Arms:  Skin:    General: Skin is warm and dry.     Capillary Refill: Capillary refill takes less than 2 seconds.     Findings: Erythema and lesion present.  Neurological:     Mental Status: He is alert.     Sensory: No sensory deficit.     Motor: No weakness.  Psychiatric:        Mood and Affect: Mood normal.        Behavior: Behavior normal.      UC Treatments / Results  Labs (all labs ordered are listed,  but only abnormal results are displayed) Labs Reviewed - No data to display  EKG   Radiology No results found.  Procedures Procedures (including critical care time)  Medications Ordered in UC Medications - No data to display  Initial Impression / Assessment and Plan / UC Course  I have reviewed the triage vital signs and the nursing notes.  Pertinent labs & imaging results that were available during my care of the patient were  reviewed by me and considered in my medical decision making (see chart for details).   Yellowjacket sting, right elbow swelling, rash.  911 and ED precautions discussed.  Education provided on bee stings.  Treating today with prednisone taper.  Instructed patient to continue Benadryl (patient preference) and to start Pepcid daily x 5 days.  Instructed him to follow-up with his PCP tomorrow.  He agrees to plan of care.   Final Clinical Impressions(s) / UC Diagnoses   Final diagnoses:  Yellow jacket sting, accidental or unintentional, initial encounter  Elbow swelling, right  Rash     Discharge Instructions      Go to the emergency department if you have difficulty swallowing or breathing.    Take the prednisone as directed.  Take Benadryl and Pepcid as directed.    Follow-up with your primary care provider tomorrow.         ED Prescriptions     Medication Sig Dispense Auth. Provider   predniSONE (STERAPRED UNI-PAK 21 TAB) 10 MG (21) TBPK tablet Take by mouth daily. As directed 21 tablet Mickie Bail, NP      PDMP not reviewed this encounter.   Mickie Bail, NP 08/20/23 1905

## 2023-08-20 NOTE — Discharge Instructions (Addendum)
Go to the emergency department if you have difficulty swallowing or breathing.    Take the prednisone as directed.  Take Benadryl and Pepcid as directed.    Follow-up with your primary care provider tomorrow.

## 2023-08-20 NOTE — ED Triage Notes (Signed)
Patient to Urgent Care with complaints of a bee sting present to his right elbow.  Symptoms started yesterday. Reports he is allergic to wasp/ bee stings and has previously required steroids.  Taking benadryl. States area is painful and becoming more swollen.

## 2024-06-26 ENCOUNTER — Ambulatory Visit
Admission: EM | Admit: 2024-06-26 | Discharge: 2024-06-26 | Disposition: A | Discharge status: Home / Self Care | Attending: Emergency Medicine | Admitting: Emergency Medicine

## 2024-06-26 DIAGNOSIS — T63461A Toxic effect of venom of wasps, accidental (unintentional), initial encounter: Secondary | ICD-10-CM | POA: Diagnosis not present

## 2024-06-26 DIAGNOSIS — R220 Localized swelling, mass and lump, head: Secondary | ICD-10-CM

## 2024-06-26 MED ORDER — METHYLPREDNISOLONE ACETATE 80 MG/ML IJ SUSP
80.0000 mg | Freq: Once | INTRAMUSCULAR | Status: AC
  Administered 2024-06-26: 80 mg via INTRAMUSCULAR

## 2024-06-26 MED ORDER — PREDNISONE 20 MG PO TABS: 40.0000 mg | ORAL_TABLET | Freq: Every day | ORAL | 0 refills | Status: AC

## 2024-06-26 NOTE — Discharge Instructions (Addendum)
 Today you were evaluated for your seen, there is redness and swelling to the face  You have been given an injection of steroids to help reduce the inflammatory process and to help reduce symptoms, should see great improvement within the hour  Started tomorrow take prednisone  every morning with food for 5 days to continue the above process and keep a late reaction from occurring  You may take Claritin, Zyrtec or Benadryl as needed  You may continue to hold ice over the affected area 10 to 15-minute intervals  At any point if you begin to have any difficulty breathing administer EpiPen and go to the nearest emergency department for immediate evaluation

## 2024-06-26 NOTE — ED Provider Notes (Signed)
 CAY RALPH PELT    CSN: [id number] Arrival date & time: 06/26/24  1203      History   Chief Complaint Chief Complaint  Patient presents with   Insect Bite    HPI TRYSTAN AKHTAR is a 68 y.o. male.   Patient presents for evaluation of left-sided facial swelling beginning today approximately 30 minutes ago after accidentally being stung by a wasp.  History of anaphylactic reactions.  Denies difficulty breathing, shortness of breath, wheezing, chest pain or tightness, throat swelling or tightness or difficulty swallowing.  Has not attempted treatment.  Past Medical History:  Diagnosis Date   Allergic state    Anxiety    Asthma    Coronary artery disease    Depression    Diabetes mellitus without complication (HCC)    Diverticulosis    GERD (gastroesophageal reflux disease)    Hyperlipemia    Hypertension    Midsystolic murmur    Myocardial infarction (HCC) 1995 and 2008   Prostatitis    Proteinuria    Sleep apnea     Patient Active Problem List   Diagnosis Date Noted   CAD (coronary artery disease), autologous vein bypass graft 08/23/2020   Neuroforaminal stenosis of lumbar spine 08/23/2020   Acute right-sided low back pain with right-sided sciatica 07/27/2020   Lumbar radicular pain 07/27/2020   S/P drug eluting coronary stent placement 07/10/2020   Lumbar facet joint syndrome 01/21/2020   Rotator cuff tendinitis, right 01/20/2019   Strain of right shoulder 01/20/2019   Lateral epicondylitis, left elbow 03/18/2018   Type 2 diabetes mellitus with microalbuminuria (HCC) 06/02/2014   Coronary artery disease involving native coronary artery of native heart with angina pectoris (HCC) 05/20/2012    Past Surgical History:  Procedure Laterality Date   COLON SURGERY  2008   6 inches removed due to diverticulosis   COLONOSCOPY WITH PROPOFOL  N/A 09/13/2016   Procedure: COLONOSCOPY WITH PROPOFOL ;  Surgeon: Lamar ONEIDA Holmes, MD;  Location: [facility] ENDOSCOPY;  Service:  Endoscopy;  Laterality: N/A;   COLONOSCOPY WITH PROPOFOL  N/A 12/04/2022   Procedure: COLONOSCOPY WITH PROPOFOL ;  Surgeon: Toledo, Ladell POUR, MD;  Location: [facility] ENDOSCOPY;  Service: Gastroenterology;  Laterality: N/A;   CORONARY ANGIOPLASTY     6 stents on 2 separate procedures   CORONARY ARTERY BYPASS GRAFT  09/28/1994   2 vessels   double bipass     ESOPHAGOGASTRODUODENOSCOPY  11/19/2013   ESOPHAGOGASTRODUODENOSCOPY (EGD) WITH PROPOFOL  N/A 12/04/2022   Procedure: ESOPHAGOGASTRODUODENOSCOPY (EGD) WITH PROPOFOL ;  Surgeon: Toledo, Ladell POUR, MD;  Location: [facility] ENDOSCOPY;  Service: Gastroenterology;  Laterality: N/A;   sinus sugery     TENDON RECONSTRUCTION Right 03/26/2018   Procedure: DEBRIDEMENT OF THE COMMON EXTENSOR ORIGIN OF ELBOW;  Surgeon: Edie Norleen PARAS, MD;  Location: [facility] ORS;  Service: Orthopedics;  Laterality: Right;   TONSILLECTOMY         Home Medications    Prior to Admission medications   Medication Sig Start Date End Date Taking? Authorizing Provider  aspirin 81 MG tablet Take 81 mg by mouth every evening.    Yes [provider]  CALCIUM POLYCARBOPHIL PO Take 1 tablet by mouth daily.   Yes [provider]  DULoxetine (CYMBALTA) 60 MG capsule Take 60 mg by mouth daily.   Yes [provider]  Empagliflozin (JARDIANCE PO) Take 20 mg by mouth daily.   Yes [provider]  ezetimibe (ZETIA) 10 MG tablet Take 10 mg by mouth daily.   Yes [provider]  glimepiride (AMARYL) 2 MG tablet Take 2 mg by mouth daily with breakfast.   Yes [provider]  metoprolol  succinate (TOPROL -XL) 25 MG 24 hr tablet Take 25 mg by mouth every evening.    Yes [provider]  predniSONE  (DELTASONE ) 20 MG tablet Take 2 tablets (40 mg total) by mouth daily. 06/26/24  Yes Keirsten Matuska R, NP  rosuvastatin (CRESTOR) 20 MG tablet Take 20 mg by mouth every evening.    Yes [provider]  azelastine (ASTELIN) 0.1 % nasal spray  Place into the nose. 04/30/21 04/30/22  [provider]  clopidogrel (PLAVIX) 75 MG tablet Take 75 mg by mouth daily. Patient not taking: Reported on 12/04/2022 09/25/20   [provider]  clopidogrel (PLAVIX) 75 MG tablet Take 1 tablet by mouth daily. Patient not taking: Reported on 11/08/2021 06/28/21   [provider]  diphenhydrAMINE (BENADRYL) 25 MG tablet Take 25 mg by mouth daily as needed for allergies.    [provider]  DULoxetine (CYMBALTA) 60 MG capsule Take 1 capsule by mouth daily. Patient not taking: Reported on 11/08/2021 07/31/21   [provider]  EPINEPHrine 0.3 mg/0.3 mL IJ SOAJ injection Inject into the muscle once.    [provider]  gabapentin  (NEURONTIN ) 600 MG tablet Take 1 tablet (600 mg total) by mouth every 8 (eight) hours. 09/27/21   Marcelino Nurse, MD  gabapentin  (NEURONTIN ) 600 MG tablet 2 (two) times daily Patient not taking: Reported on 11/08/2021 10/24/21   [provider]  glucose blood test strip 1 each by Other route as needed for other. Use as instructed    [provider]  JARDIANCE 25 MG TABS tablet Take 25 mg by mouth daily. 09/17/21   [provider]  losartan (COZAAR) 100 MG tablet Take 100 mg by mouth daily. Patient not taking: Reported on 12/04/2022 08/05/21   [provider]  losartan (COZAAR) 25 MG tablet Take 25 mg by mouth every evening.  Patient not taking: Reported on 11/08/2021    [provider]  nitroGLYCERIN (NITROSTAT) 0.4 MG SL tablet Place 0.4 mg under the tongue every 5 (five) minutes as needed for chest pain.    [provider]  OZEMPIC, 0.25 OR 0.5 MG/DOSE, 2 MG/3ML SOPN Inject into the skin.    [provider]  pantoprazole (PROTONIX) 40 MG tablet Take 40 mg by mouth daily.     [provider]  pantoprazole (PROTONIX) 40 MG tablet Take 1 tablet by mouth at bedtime. 08/24/21   [provider]  polycarbophil  (FIBERCON) 625 MG tablet Take 625 mg by mouth every evening.    [provider]  spironolactone (ALDACTONE) 25 MG tablet Take by mouth. 10/26/21 10/26/22  [provider]  STUDY - ASPIRE - aspirin 81 mg or placebo tablet (PI-Sethi) Take 1 tablet by mouth every evening. Patient not taking: Reported on 12/04/2022 07/13/08   [provider]  traZODone (DESYREL) 50 MG tablet Take 50 mg by mouth at bedtime as needed for sleep.    [provider]    Family History Family History  Problem Relation Age of Onset   Cancer Mother    Heart disease Mother    Cancer Father    Heart disease Father     Social History Social History   Tobacco Use   Smoking status: Former    Current packs/day: 0.00    Average packs/day: 1 pack/day for 25.0 years (25.0 ttl pk-yrs)  Types: Cigarettes    Start date: 09/23/1969    Quit date: 09/23/1994    Years since quitting: 29.7   Smokeless tobacco: Never  Vaping Use   Vaping status: Never Used  Substance Use Topics   Alcohol use: Yes    Comment: occassional   Drug use: No     Allergies   Bee venom, Ace inhibitors, Eplerenone, Isosorbide, Lamotrigine, Metformin and related, Semaglutide, Spironolactone, Bupropion, and Crestor [rosuvastatin calcium]   Review of Systems Review of Systems   Physical Exam Triage Vital Signs ED Triage Vitals  Encounter Vitals Group     BP 06/26/24 1209 134/80     Girls Systolic BP Percentile --      Girls Diastolic BP Percentile --      Boys Systolic BP Percentile --      Boys Diastolic BP Percentile --      Pulse Rate 06/26/24 1209 69     Resp 06/26/24 1209 17     Temp 06/26/24 1209 98.3 F (36.8 C)     Temp Source 06/26/24 1209 Oral     SpO2 06/26/24 1209 99 %     Weight --      Height --      Head Circumference --      Peak Flow --      Pain Score 06/26/24 1208 0     Pain Loc --      Pain Education --      Exclude from Growth Chart --    No data found.  Updated  Vital Signs BP 134/80 (BP Location: Left Arm)   Pulse 69   Temp 98.3 F (36.8 C) (Oral)   Resp 17   SpO2 99%   Visual Acuity Right Eye Distance:   Left Eye Distance:   Bilateral Distance:    Right Eye Near:   Left Eye Near:    Bilateral Near:     Physical Exam Constitutional:      Appearance: Normal appearance.  Eyes:     Comments: Erythema and mild to moderate swelling present to the left lower periorbital and the left cheek, no visual disturbance  Pulmonary:     Effort: Pulmonary effort is normal.  Neurological:     Mental Status: He is alert and oriented to person, place, and time. Mental status is at baseline.      UC Treatments / Results  Labs (all labs ordered are listed, but only abnormal results are displayed) Labs Reviewed - No data to display  EKG   Radiology No results found.  Procedures Procedures (including critical care time)  Medications Ordered in UC Medications  methylPREDNISolone  acetate (DEPO-MEDROL ) injection 80 mg (80 mg Intramuscular Given 06/26/24 1211)    Initial Impression / Assessment and Plan / UC Course  I have reviewed the triage vital signs and the nursing notes.  Pertinent labs & imaging results that were available during my care of the patient were reviewed by me and considered in my medical decision making (see chart for details).  Facial swelling, less pain accidental  Swelling present on exam, no involvement of the eye or visual disturbance, no respiratory involvement, stable for outpatient management, methylprednisolone  IM given on reevaluation after 10 to 15 minutes reevaluating symptoms are improving, prednisone  prescribed for home use and discussed supportive care, given ER precautions Final Clinical Impressions(s) / UC Diagnoses   Final diagnoses:  Wasp sting, accidental or unintentional, initial encounter  Facial swelling     Discharge Instructions  Today you were evaluated for your seen, there is redness  and swelling to the face  You have been given an injection of steroids to help reduce the inflammatory process and to help reduce symptoms, should see great improvement within the hour  Started tomorrow take prednisone  every morning with food for 5 days to continue the above process and keep a late reaction from occurring  You may take Claritin, Zyrtec or Benadryl as needed  You may continue to hold ice over the affected area 10 to 15-minute intervals  At any point if you begin to have any difficulty breathing administer EpiPen and go to the nearest emergency department for immediate evaluation   ED Prescriptions     Medication Sig Dispense Auth. Provider   predniSONE  (DELTASONE ) 20 MG tablet Take 2 tablets (40 mg total) by mouth daily. 10 tablet Shauntelle Jamerson R, NP      PDMP not reviewed this encounter.   Teresa Shelba SAUN[place], [place] 06/26/24 1219

## 2024-06-26 NOTE — ED Triage Notes (Signed)
 Patient states that he's allergic to wasp stings. Patient was stung in the face by a wasp about 30 min ago. Patient states that he's not having in swelling or throat tightness. Left eye. No vision changes.
